# Patient Record
Sex: Male | Born: 1990 | Race: Black or African American | Hispanic: No | Marital: Single | Smoking: Never smoker
Health system: Southern US, Community
[De-identification: ages and names within clinical notes are randomized; demographics above are authoritative.]

## PROBLEM LIST (undated history)

## (undated) DIAGNOSIS — J309 Allergic rhinitis, unspecified: Secondary | ICD-10-CM

## (undated) DIAGNOSIS — K649 Unspecified hemorrhoids: Secondary | ICD-10-CM

## (undated) HISTORY — DX: Unspecified hemorrhoids: K64.9

## (undated) HISTORY — DX: Allergic rhinitis, unspecified: J30.9

## (undated) HISTORY — PX: HEMORRHOID BANDING: SHX5850

---
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
## 2011-10-22 ENCOUNTER — Encounter: Payer: Self-pay | Admitting: Internal Medicine

## 2011-10-22 DIAGNOSIS — Z0001 Encounter for general adult medical examination with abnormal findings: Secondary | ICD-10-CM | POA: Insufficient documentation

## 2011-10-22 DIAGNOSIS — Z0000 Encounter for general adult medical examination without abnormal findings: Secondary | ICD-10-CM | POA: Insufficient documentation

## 2011-10-28 ENCOUNTER — Ambulatory Visit (INDEPENDENT_AMBULATORY_CARE_PROVIDER_SITE_OTHER): Payer: BC Managed Care – PPO | Admitting: Internal Medicine

## 2011-10-28 ENCOUNTER — Encounter: Payer: Self-pay | Admitting: Internal Medicine

## 2011-10-28 ENCOUNTER — Other Ambulatory Visit (INDEPENDENT_AMBULATORY_CARE_PROVIDER_SITE_OTHER): Payer: BC Managed Care – PPO

## 2011-10-28 VITALS — BP 118/78 | HR 75 | Temp 97.9°F | Ht 69.0 in | Wt 163.0 lb

## 2011-10-28 DIAGNOSIS — M928 Other specified juvenile osteochondrosis: Secondary | ICD-10-CM

## 2011-10-28 DIAGNOSIS — Z0000 Encounter for general adult medical examination without abnormal findings: Secondary | ICD-10-CM

## 2011-10-28 DIAGNOSIS — M92529 Juvenile osteochondrosis of tibia tubercle, unspecified leg: Secondary | ICD-10-CM | POA: Insufficient documentation

## 2011-10-28 LAB — URINALYSIS, ROUTINE W REFLEX MICROSCOPIC
Bilirubin Urine: NEGATIVE
Hgb urine dipstick: NEGATIVE
Leukocytes, UA: NEGATIVE
Nitrite: NEGATIVE
Specific Gravity, Urine: >=1.03 (ref 1.000–1.030)
Urine Glucose: NEGATIVE
Urobilinogen, UA: 0.2 (ref 0.0–1.0)
pH: 6 (ref 5.0–8.0)

## 2011-10-28 LAB — LIPID PANEL
Cholesterol: 111 mg/dL (ref 0–200)
HDL: 45.3 mg/dL (ref >39.00)
LDL Cholesterol: 57 mg/dL (ref 0–99)
Total CHOL/HDL Ratio: 2
Triglycerides: 46 mg/dL (ref 0.0–149.0)
VLDL: 9.2 mg/dL (ref 0.0–40.0)

## 2011-10-28 LAB — HEPATIC FUNCTION PANEL
ALT: 18 U/L (ref 0–53)
AST: 22 U/L (ref 0–37)
Albumin: 4.3 g/dL (ref 3.5–5.2)
Alkaline Phosphatase: 54 U/L (ref 39–117)
Bilirubin, Direct: 0.1 mg/dL (ref 0.0–0.3)
Total Bilirubin: 0.7 mg/dL (ref 0.3–1.2)
Total Protein: 6.9 g/dL (ref 6.0–8.3)

## 2011-10-28 LAB — BASIC METABOLIC PANEL
BUN: 12 mg/dL (ref 6–23)
CO2: 30 mEq/L (ref 19–32)
Calcium: 9.4 mg/dL (ref 8.4–10.5)
Chloride: 102 mEq/L (ref 96–112)
Creatinine, Ser: 1.1 mg/dL (ref 0.4–1.5)
GFR: 110.29 mL/min (ref >60.00)
Glucose, Bld: 93 mg/dL (ref 70–99)
Potassium: 3.5 mEq/L (ref 3.5–5.1)
Sodium: 140 mEq/L (ref 135–145)

## 2011-10-28 LAB — CBC WITH DIFFERENTIAL/PLATELET
Basophils Absolute: 0 10*3/uL (ref 0.0–0.1)
Basophils Relative: 0.8 % (ref 0.0–3.0)
Eosinophils Absolute: 0.1 10*3/uL (ref 0.0–0.7)
Eosinophils Relative: 2.7 % (ref 0.0–5.0)
HCT: 43.1 % (ref 39.0–52.0)
Hemoglobin: 14.5 g/dL (ref 13.0–17.0)
Lymphocytes Relative: 36.1 % (ref 12.0–46.0)
Lymphs Abs: 1.5 10*3/uL (ref 0.7–4.0)
MCHC: 33.8 g/dL (ref 30.0–36.0)
MCV: 84 fl (ref 78.0–100.0)
Monocytes Absolute: 0.7 10*3/uL (ref 0.1–1.0)
Monocytes Relative: 16.5 % — ABNORMAL HIGH (ref 3.0–12.0)
Neutro Abs: 1.9 10*3/uL (ref 1.4–7.7)
Neutrophils Relative %: 43.9 % (ref 43.0–77.0)
Platelets: 208 10*3/uL (ref 150.0–400.0)
RBC: 5.13 Mil/uL (ref 4.22–5.81)
RDW: 13.5 % (ref 11.5–14.6)
WBC: 4.3 10*3/uL — ABNORMAL LOW (ref 4.5–10.5)

## 2011-10-28 LAB — TSH: TSH: 2.55 u[IU]/mL (ref 0.35–5.50)

## 2011-10-28 MED ORDER — NAPROXEN 500 MG PO TABS: 500.0000 mg | ORAL_TABLET | Freq: Two times a day (BID) | ORAL | Status: AC

## 2011-10-28 NOTE — Patient Instructions (Signed)
Take all new medications as prescribed Continue all other medications as before Please go to LAB in the Basement for the blood and/or urine tests to be done today Please call the phone number [phone] (the [employer] System) for results of testing in 2-3 days;  When calling, simply dial the number, and when prompted enter the MRN number above (the Medical Record Number) and the # key, then the message should start. Please return in 1 year for your yearly visit, or sooner if needed

## 2011-10-29 ENCOUNTER — Encounter: Payer: Self-pay | Admitting: Internal Medicine

## 2011-10-29 NOTE — Progress Notes (Signed)
  Subjective:    Patient ID: Xavier Cochran, male    DOB: [birth year], 20 y.o.   MRN: [id number]  HPI here with father as new pt - Here for wellness and f/u;  Overall doing ok;  Pt denies CP, worsening SOB, DOE, wheezing, orthopnea, PND, worsening LE edema, palpitations, dizziness or syncope.  Pt denies neurological change such as new Headache, facial or extremity weakness.  Pt denies polydipsia, polyuria, or low sugar symptoms. Pt states overall good compliance with treatment and medications, good tolerability, and trying to follow lower cholesterol diet.  Pt denies worsening depressive symptoms, suicidal ideation or panic. No fever, wt loss, night sweats, loss of appetite, or other constitutional symptoms.  Pt states good ability with ADL's, low fall risk, home safety reviewed and adequate, no significant changes in hearing or vision, and regularly with exercise, c/o discomfort to the patellar/tibial insertion site with mild swelling, plays basketball frequently.[name] History reviewed. No pertinent past medical history. History reviewed. No pertinent past surgical history.  reports that he has never smoked. He does not have any smokeless tobacco history on file. He reports that he does not drink alcohol or use illicit drugs. family history includes Arthritis in his other and Hypertension in his other. No Known Allergies No current outpatient prescriptions on file prior to visit.   Review of Systems Review of Systems  Constitutional: Negative for diaphoresis, activity change, appetite change and unexpected weight change.  HENT: Negative for hearing loss, ear pain, facial swelling, mouth sores and neck stiffness.   Eyes: Negative for pain, redness and visual disturbance.  Respiratory: Negative for shortness of breath and wheezing.   Cardiovascular: Negative for chest pain and palpitations.  Gastrointestinal: Negative for diarrhea, blood in stool, abdominal distention and rectal pain.  Genitourinary: Negative  for hematuria, flank pain and decreased urine volume.  Musculoskeletal: Negative for myalgias and joint swelling.  Skin: Negative for color change and wound.  Neurological: Negative for syncope and numbness.  Hematological: Negative for adenopathy.  Psychiatric/Behavioral: Negative for hallucinations, self-injury, decreased concentration and agitation.      Objective:   Physical Exam BP 118/78  Pulse 75  Temp(Src) 97.9 F (36.6 C) (Oral)  Ht 5\' 9"  (1.753 m)  Wt 163 lb (73.936 kg)  BMI 24.07 kg/m2  SpO2 98% Physical Exam  VS noted Constitutional: Pt is oriented to person, place, and time. Appears well-developed and well-nourished.  HENT:  Head: Normocephalic and atraumatic.  Right Ear: External ear normal.  Left Ear: External ear normal.  Nose: Nose normal.  Mouth/Throat: Oropharynx is clear and moist.  Eyes: Conjunctivae and EOM are normal. Pupils are equal, round, and reactive to light.  Neck: Normal range of motion. Neck supple. No JVD present. No tracheal deviation present.  Cardiovascular: Normal rate, regular rhythm, normal heart sounds and intact distal pulses.   Pulmonary/Chest: Effort normal and breath sounds normal.  Abdominal: Soft. Bowel sounds are normal. There is no tenderness.  Musculoskeletal: Normal range of motion. Exhibits no edema.  Lymphadenopathy:  Has no cervical adenopathy.  Neurological: Pt is alert and oriented to person, place, and time. Pt has normal reflexes. No cranial nerve deficit.  Skin: Skin is warm and dry. No rash noted.  Psychiatric:  Has  normal mood and affect. Behavior is normal.  Bilat patellar/tibial insertion sites mild tender/firm swelling    Assessment & Plan:

## 2011-10-29 NOTE — Assessment & Plan Note (Signed)

## 2011-10-29 NOTE — Assessment & Plan Note (Signed)
Mild to mod, for nsiad prn,  to f/u any worsening symptoms or concerns

## 2012-01-14 ENCOUNTER — Encounter: Payer: Self-pay | Admitting: Family Medicine

## 2012-01-14 ENCOUNTER — Ambulatory Visit (INDEPENDENT_AMBULATORY_CARE_PROVIDER_SITE_OTHER): Payer: BC Managed Care – PPO | Admitting: Family Medicine

## 2012-01-14 VITALS — BP 110/70 | Temp 98.7°F | Wt 160.0 lb

## 2012-01-14 DIAGNOSIS — R300 Dysuria: Secondary | ICD-10-CM | POA: Insufficient documentation

## 2012-01-14 DIAGNOSIS — R3915 Urgency of urination: Secondary | ICD-10-CM

## 2012-01-14 LAB — POCT URINALYSIS DIPSTICK
Bilirubin, UA: NEGATIVE
Blood, UA: NEGATIVE
Glucose, UA: NEGATIVE
Ketones, UA: NEGATIVE
Leukocytes, UA: NEGATIVE
Nitrite, UA: NEGATIVE
Protein, UA: NEGATIVE
Spec Grav, UA: >=1.03
Urobilinogen, UA: 0.2
pH, UA: 6

## 2012-01-14 NOTE — Patient Instructions (Signed)
Follow up with your regular doctor as needed, if you continue to have symptoms.  We'll contact you with your lab report.

## 2012-01-14 NOTE — Assessment & Plan Note (Signed)
Now with no sx, normal exam, and normal U/a.  Unable to check GC/Chlam due to recent urination.  Will check u micro and ucx, will have pt f/u with PMD if sx continue.  Per patient, was prev tested for STDs and was neg.

## 2012-01-14 NOTE — Progress Notes (Signed)
Sx started about 1 week ago.  Had a few sharp pains in R side of abd/back, then some urinary frequency.  Middle of the week had some lower anterior abd pain. "The tube that comes up from the right testicle is hurting, off and on" for last 3 days.  No burning with urination.  No FCNAVD.  No discharge.  No h/o STD.  No unprotected sex, only protected.    U/a was collected before we could consider collecting GC/chlam probe.     Meds, vitals, and allergies reviewed.   ROS: See HPI.  Otherwise, noncontributory.  nad ncat Mmm rrr ctab abd soft, not ttp, no rebound, no masses No cva pain Testes bilaterally descended without nodularity, tenderness or masses. No scrotal masses or lesions. No penis lesions or urethral discharge. No hernia

## 2012-01-14 NOTE — Progress Notes (Signed)
Addended by: Melchor Amour on: 01/14/2012 11:13 AM   Modules accepted: Orders

## 2012-10-24 ENCOUNTER — Encounter: Payer: Self-pay | Admitting: Internal Medicine

## 2012-10-24 ENCOUNTER — Ambulatory Visit (INDEPENDENT_AMBULATORY_CARE_PROVIDER_SITE_OTHER): Payer: BC Managed Care – PPO | Admitting: Internal Medicine

## 2012-10-24 VITALS — BP 102/78 | HR 66 | Temp 98.1°F | Ht 69.0 in | Wt 161.0 lb

## 2012-10-24 DIAGNOSIS — K645 Perianal venous thrombosis: Secondary | ICD-10-CM

## 2012-10-24 MED ORDER — HYDROCORTISONE 2.5 % EX CREA: TOPICAL_CREAM | Freq: Two times a day (BID) | CUTANEOUS | Status: DC

## 2012-10-24 NOTE — Patient Instructions (Addendum)
Take all new medications as prescribed Continue all other medications as before Please remember to sign up for My Chart at your earliest convenience, as this will be important to you in the future with finding out test results.  

## 2012-10-26 ENCOUNTER — Encounter: Payer: Self-pay | Admitting: Internal Medicine

## 2012-10-26 DIAGNOSIS — J309 Allergic rhinitis, unspecified: Secondary | ICD-10-CM | POA: Insufficient documentation

## 2012-10-26 NOTE — Assessment & Plan Note (Signed)
Mild to mod, for topical steroid cream prn,   to f/u any worsening symptoms or concerns 

## 2012-10-26 NOTE — Progress Notes (Signed)
  Subjective:    Patient ID: Xavier Geraghty, male    DOB: 10-Aug-1991, 21 y.o.   MRN: [id number]  HPI  Here to f/u, c/o new onset mild discomfort to anal area, wondering if it might be some kind of cyst, nothing makes better, worse to sit, onset x 1 wk, worse pain to start, now somewhat better, no bleeding or prior hx.   Past Medical History  Diagnosis Date  . Allergic rhinitis, cause unspecified    History reviewed. No pertinent past surgical history.  reports that he has never smoked. He does not have any smokeless tobacco history on file. He reports that he does not drink alcohol or use illicit drugs. family history includes Arthritis in his other; Cancer in his maternal grandfather; Diabetes in his maternal grandfather; Hypertension in his other; and Lupus in his mother. No Known Allergies Current Outpatient Prescriptions on File Prior to Visit  Medication Sig Dispense Refill  . naproxen (NAPROSYN) 500 MG tablet Take 1 tablet (500 mg total) by mouth 2 (two) times daily with a meal.  60 tablet  5   Review of Systems All otherwise neg per pt    Objective:   Physical Exam BP 102/78  Pulse 66  Temp 98.1 F (36.7 C) (Oral)  Ht 5\' 9"  (1.753 m)  Wt 161 lb (73.029 kg)  BMI 23.78 kg/m2  SpO2 98% Physical Exam  VS noted Constitutional: Pt appears well-developed and well-nourished.  HENT: Head: Normocephalic.  Right Ear: External ear normal.  Left Ear: External ear normal.  Eyes: Conjunctivae and EOM are normal. Neck: Normal range of motion. Neck supple.  Cardiovascular: Normal rate and regular rhythm.   Pulmonary/Chest: Effort normal and breath sounds normal.  Abd:  Soft, NT, +BS Rectal:  + mild tender 1/2 cm thrombosed ext hemorrhoid Neurological: Pt is alert. Not confused  Skin: Skin is warm. No erythema.     Assessment & Plan:

## 2012-10-29 ENCOUNTER — Ambulatory Visit: Payer: BC Managed Care – PPO | Admitting: Internal Medicine

## 2012-11-02 ENCOUNTER — Encounter: Payer: Self-pay | Admitting: Internal Medicine

## 2012-11-02 ENCOUNTER — Ambulatory Visit: Payer: BC Managed Care – PPO

## 2012-11-02 ENCOUNTER — Ambulatory Visit (INDEPENDENT_AMBULATORY_CARE_PROVIDER_SITE_OTHER): Payer: BC Managed Care – PPO | Admitting: Internal Medicine

## 2012-11-02 VITALS — BP 100/62 | HR 68 | Temp 98.0°F | Ht 69.0 in | Wt 160.5 lb

## 2012-11-02 DIAGNOSIS — Z0000 Encounter for general adult medical examination without abnormal findings: Secondary | ICD-10-CM

## 2012-11-02 DIAGNOSIS — A64 Unspecified sexually transmitted disease: Secondary | ICD-10-CM | POA: Insufficient documentation

## 2012-11-02 DIAGNOSIS — R300 Dysuria: Secondary | ICD-10-CM

## 2012-11-02 DIAGNOSIS — R3915 Urgency of urination: Secondary | ICD-10-CM

## 2012-11-02 LAB — CBC WITH DIFFERENTIAL/PLATELET
Basophils Relative: 1 % (ref 0.0–3.0)
Eosinophils Absolute: 0.1 10*3/uL (ref 0.0–0.7)
Eosinophils Relative: 2.5 % (ref 0.0–5.0)
Hemoglobin: 15.5 g/dL (ref 13.0–17.0)
Lymphocytes Relative: 42.1 % (ref 12.0–46.0)
MCHC: 33 g/dL (ref 30.0–36.0)
MCV: 83.5 fl (ref 78.0–100.0)
Neutro Abs: 1.6 10*3/uL (ref 1.4–7.7)
Neutrophils Relative %: 41.5 % — ABNORMAL LOW (ref 43.0–77.0)
RBC: 5.62 Mil/uL (ref 4.22–5.81)
WBC: 3.8 10*3/uL — ABNORMAL LOW (ref 4.5–10.5)

## 2012-11-02 LAB — LIPID PANEL
Cholesterol: 128 mg/dL (ref 0–200)
HDL: 47.1 mg/dL (ref >39.00)
LDL Cholesterol: 72 mg/dL (ref 0–99)
Total CHOL/HDL Ratio: 3
Triglycerides: 46 mg/dL (ref 0.0–149.0)
VLDL: 9.2 mg/dL (ref 0.0–40.0)

## 2012-11-02 LAB — BASIC METABOLIC PANEL
BUN: 16 mg/dL (ref 6–23)
Calcium: 9.6 mg/dL (ref 8.4–10.5)
Creatinine, Ser: 1.2 mg/dL (ref 0.4–1.5)

## 2012-11-02 LAB — URINALYSIS, ROUTINE W REFLEX MICROSCOPIC
Total Protein, Urine: NEGATIVE
Urine Glucose: NEGATIVE
pH: 6 (ref 5.0–8.0)

## 2012-11-02 LAB — HEPATIC FUNCTION PANEL
ALT: 19 U/L (ref 0–53)
Bilirubin, Direct: 0.2 mg/dL (ref 0.0–0.3)
Total Bilirubin: 0.7 mg/dL (ref 0.3–1.2)

## 2012-11-02 NOTE — Assessment & Plan Note (Signed)
For lab eval today, exam benign

## 2012-11-02 NOTE — Patient Instructions (Addendum)
Continue all other medications as before Please go to LAB in the Basement for the blood and/or urine tests to be done today You will be contacted by phone if any changes need to be made immediately.  Otherwise, you will receive a letter about your results with an explanation, but please check with MyChart first. Thank you for enrolling in MyChart. Please follow the instructions below to securely access your online medical record. MyChart allows you to send messages to your doctor, view your test results, renew your prescriptions, schedule appointments, and more. To Log into MyChart, please go to [email or web address], and your Username is:  [email or web address] You are otherwise up to date with prevention measures Please return in 1 year for your yearly visit, or sooner if needed, with Lab testing done 3-5 days before

## 2012-11-02 NOTE — Assessment & Plan Note (Signed)

## 2012-11-02 NOTE — Progress Notes (Signed)
Subjective:    Patient ID: Xavier Cochran, male    DOB: 1991/03/13, 21 y.o.   MRN: [id number]  HPI  Here for wellness and f/u;  Overall doing ok;  Pt denies CP, worsening SOB, DOE, wheezing, orthopnea, PND, worsening LE edema, palpitations, dizziness or syncope.  Pt denies neurological change such as new Headache, facial or extremity weakness.  Pt denies polydipsia, polyuria, or low sugar symptoms. Pt states overall good compliance with treatment and medications, good tolerability, and trying to follow lower cholesterol diet.  Pt denies worsening depressive symptoms, suicidal ideation or panic. No fever, wt loss, night sweats, loss of appetite, or other constitutional symptoms.  Pt states good ability with ADL's, low fall risk, home safety reviewed and adequate, no significant changes in hearing or vision, and occasionally active with exercise.   Denies urinary symptoms such as dysuria, frequency, urgency,or hematuria, or penile d/c, but also asks for STD testing today Past Medical History  Diagnosis Date  . Allergic rhinitis, cause unspecified    No past surgical history on file.  reports that he has never smoked. He does not have any smokeless tobacco history on file. He reports that he does not drink alcohol or use illicit drugs. family history includes Arthritis in his other; Cancer in his maternal grandfather; Diabetes in his maternal grandfather; Hypertension in his other; and Lupus in his mother. No Known Allergies Current Outpatient Prescriptions on File Prior to Visit  Medication Sig Dispense Refill  . hydrocortisone 2.5 % cream Apply topically 2 (two) times daily.  30 g  0   Review of Systems Review of Systems  Constitutional: Negative for diaphoresis, activity change, appetite change and unexpected weight change.  HENT: Negative for hearing loss, ear pain, facial swelling, mouth sores and neck stiffness.   Eyes: Negative for pain, redness and visual disturbance.  Respiratory: Negative  for shortness of breath and wheezing.   Cardiovascular: Negative for chest pain and palpitations.  Gastrointestinal: Negative for diarrhea, blood in stool, abdominal distention and rectal pain.  Genitourinary: Negative for hematuria, flank pain and decreased urine volume.  Musculoskeletal: Negative for myalgias and joint swelling.  Skin: Negative for color change and wound.  Neurological: Negative for syncope and numbness.  Hematological: Negative for adenopathy.  Psychiatric/Behavioral: Negative for hallucinations, self-injury, decreased concentration and agitation.      Objective:   Physical Exam BP 100/62  Pulse 68  Temp 98 F (36.7 C) (Oral)  Ht 5\' 9"  (1.753 m)  Wt 160 lb 8 oz (72.802 kg)  BMI 23.70 kg/m2  SpO2 96% Physical Exam  VS noted Constitutional: Pt is oriented to person, place, and time. Appears well-developed and well-nourished.  HENT:  Head: Normocephalic and atraumatic.  Right Ear: External ear normal.  Left Ear: External ear normal.  Nose: Nose normal.  Mouth/Throat: Oropharynx is clear and moist.  Eyes: Conjunctivae and EOM are normal. Pupils are equal, round, and reactive to light.  Neck: Normal range of motion. Neck supple. No JVD present. No tracheal deviation present.  Cardiovascular: Normal rate, regular rhythm, normal heart sounds and intact distal pulses.   Pulmonary/Chest: Effort normal and breath sounds normal.  Abdominal: Soft. Bowel sounds are normal. There is no tenderness.  Musculoskeletal: Normal range of motion. Exhibits no edema.  Lymphadenopathy:  Has no cervical adenopathy.  Neurological: Pt is alert and oriented to person, place, and time. Pt has normal reflexes. No cranial nerve deficit.  Skin: Skin is warm and dry. No rash noted.  Psychiatric:  Has  normal mood and affect. Behavior is normal.     Assessment & Plan:

## 2013-01-12 ENCOUNTER — Other Ambulatory Visit: Payer: Self-pay

## 2013-06-10 ENCOUNTER — Ambulatory Visit (INDEPENDENT_AMBULATORY_CARE_PROVIDER_SITE_OTHER): Payer: BC Managed Care – PPO | Admitting: Internal Medicine

## 2013-06-10 ENCOUNTER — Ambulatory Visit: Payer: BC Managed Care – PPO | Admitting: Internal Medicine

## 2013-06-10 ENCOUNTER — Encounter: Payer: Self-pay | Admitting: Internal Medicine

## 2013-06-10 VITALS — BP 100/62 | HR 62 | Temp 97.3°F | Ht 69.0 in | Wt 161.2 lb

## 2013-06-10 DIAGNOSIS — R0609 Other forms of dyspnea: Secondary | ICD-10-CM | POA: Insufficient documentation

## 2013-06-10 DIAGNOSIS — J309 Allergic rhinitis, unspecified: Secondary | ICD-10-CM

## 2013-06-10 DIAGNOSIS — R0989 Other specified symptoms and signs involving the circulatory and respiratory systems: Secondary | ICD-10-CM

## 2013-06-10 MED ORDER — FLUTICASONE PROPIONATE 50 MCG/ACT NA SUSP: 2.0000 | Freq: Every day | NASAL | Status: DC

## 2013-06-10 MED ORDER — ALBUTEROL SULFATE HFA 108 (90 BASE) MCG/ACT IN AERS: 2.0000 | INHALATION_SPRAY | Freq: Four times a day (QID) | RESPIRATORY_TRACT | Status: DC | PRN

## 2013-06-10 MED ORDER — METHYLPREDNISOLONE ACETATE 80 MG/ML IJ SUSP
80.0000 mg | Freq: Once | INTRAMUSCULAR | Status: AC
  Administered 2013-06-10: 80 mg via INTRAMUSCULAR

## 2013-06-10 MED ORDER — CETIRIZINE HCL 10 MG PO TABS: 10.0000 mg | ORAL_TABLET | Freq: Every day | ORAL | Status: DC

## 2013-06-10 NOTE — Assessment & Plan Note (Signed)
Mild to mod, for depo 80 mg, then zyrtec/flonase, consider allergy referral  to f/u any worsening symptoms or concerns

## 2013-06-10 NOTE — Assessment & Plan Note (Signed)
Exam benign and symptoms c/w mild intermittent asthma, for trial alb MDI, hold on Cxr for now, to f/u any worsening symptoms or concerns, consider inhaled steroid/cxr if not improved

## 2013-06-10 NOTE — Addendum Note (Signed)
Addended by: Scharlene Gloss B on: 06/10/2013 10:16 AM   Modules accepted: Orders

## 2013-06-10 NOTE — Progress Notes (Signed)
  Subjective:    Patient ID: Xavier Cochran, male    DOB: 16-Oct-1991, 22 y.o.   MRN: [id number]  HPI  Here to f/u -Does have several wks ongoing nasal allergy symptoms with clearish congestion, itch and sneezing, without fever, pain, ST, cough, swelling or wheezing, with nasal congestion to the point he had to use a qtip "to keep the one side open."  Also with a sense of intermittent chest tightness, doe, mild a few times per wk, denies frank wheezing or nighttime awakening   Pt denies fever, wt loss, night sweats, loss of appetite, or other constitutional symptoms Past Medical History  Diagnosis Date  . Allergic rhinitis, cause unspecified    No past surgical history on file.  reports that he has never smoked. He does not have any smokeless tobacco history on file. He reports that he does not drink alcohol or use illicit drugs. family history includes Arthritis in his other; Cancer in his maternal grandfather; Diabetes in his maternal grandfather; Hypertension in his other; and Lupus in his mother. No Known Allergies Current Outpatient Prescriptions on File Prior to Visit  Medication Sig Dispense Refill  . hydrocortisone 2.5 % cream Apply topically 2 (two) times daily.  30 g  0   No current facility-administered medications on file prior to visit.   Review of Systems All otherwise neg per pt    Objective:   Physical Exam BP 100/62  Pulse 62  Temp(Src) 97.3 F (36.3 C) (Oral)  Ht 5\' 9"  (1.753 m)  Wt 161 lb 4 oz (73.143 kg)  BMI 23.8 kg/m2  SpO2 96% VS noted,  Constitutional: Pt appears well-developed and well-nourished.  HENT: Head: NCAT.  Right Ear: External ear normal.  Left Ear: External ear normal.  Eyes: Conjunctivae and EOM are normal. Pupils are equal, round, and reactive to light.  Neck: Normal range of motion. Neck supple.  Bilat tm's with mild erythema.  Max sinus areas mild tender.  Pharynx with mild erythema, no exudate Cardiovascular: Normal rate and regular rhythm.    Pulmonary/Chest: Effort normal and breath sounds normal.  - no rales or wheezing Abd:  Soft, NT, non-distended, + BS Neurological: Pt is alert. Not confused  Skin: Skin is warm. No erythema.  Psychiatric: Pt behavior is normal. Thought content normal.     Assessment & Plan:

## 2013-06-10 NOTE — Patient Instructions (Signed)
You had the steroid shot today Please take all new medication as prescribed - the zyrtec, flonase, and albuterol Inhaler Please continue all other medications as before, and refills have been done if requested.  Please remember to sign up for My Chart if you have not done so, as this will be important to you in the future with finding out test results, communicating by private email, and scheduling acute appointments online when needed.  I think we can hold off on further blood work or CXR today, but please return if not improved in the next 3-5 days

## 2013-09-27 DIAGNOSIS — K4090 Unilateral inguinal hernia, without obstruction or gangrene, not specified as recurrent: Secondary | ICD-10-CM | POA: Insufficient documentation

## 2013-10-03 ENCOUNTER — Other Ambulatory Visit: Payer: Self-pay

## 2013-11-15 DIAGNOSIS — R809 Proteinuria, unspecified: Secondary | ICD-10-CM | POA: Insufficient documentation

## 2014-02-03 DIAGNOSIS — M25511 Pain in right shoulder: Secondary | ICD-10-CM | POA: Insufficient documentation

## 2015-01-17 DIAGNOSIS — M25311 Other instability, right shoulder: Secondary | ICD-10-CM | POA: Insufficient documentation

## 2015-05-29 ENCOUNTER — Ambulatory Visit (INDEPENDENT_AMBULATORY_CARE_PROVIDER_SITE_OTHER): Payer: BLUE CROSS/BLUE SHIELD | Admitting: Internal Medicine

## 2015-05-29 ENCOUNTER — Encounter: Payer: Self-pay | Admitting: Internal Medicine

## 2015-05-29 VITALS — BP 112/76 | HR 68 | Temp 97.9°F | Ht 69.0 in | Wt 169.0 lb

## 2015-05-29 DIAGNOSIS — M25561 Pain in right knee: Secondary | ICD-10-CM | POA: Diagnosis not present

## 2015-05-29 MED ORDER — NAPROXEN 500 MG PO TABS: 500.0000 mg | ORAL_TABLET | Freq: Two times a day (BID) | ORAL | Status: DC

## 2015-05-29 NOTE — Progress Notes (Signed)
Pre visit review using our clinic review tool, if applicable. No additional management support is needed unless otherwise documented below in the visit note. 

## 2015-05-29 NOTE — Patient Instructions (Signed)
Please take all new medication as prescribed - the anti-inflammatory  Please also use ice three times daily for 15 minutes  OK for off work today, as well as the time off you have taken for next week to next Thursday to return to work  Please consider seeing Dr Smith/sports medicine in this office if not improved in 3-5 days  Please continue all other medications as before, and refills have been done if requested.  Please have the pharmacy call with any other refills you may need.  Please keep your appointments with your specialists as you may have planned

## 2015-05-29 NOTE — Assessment & Plan Note (Addendum)
C/w patellar tendonitis but cant r/o other given some lateral joint line tender as well  - for nsaid prn, ice tid, knee brace as he does, off work today, to f/u dr smith/sport med if not improved 3-5 days

## 2015-05-29 NOTE — Progress Notes (Signed)
   Subjective:    Patient ID: Xavier SimmerIvan Jacot Jr., male    DOB: 01/02/1991, 24 y.o.   MRN: [id number]  HPI  Here with 10 day onset sharp right knee pain, today actually not that bad, but was 9/10 last night, not sure if twisted, no falls or trauma, better with polystyrene knee brace, located mostly just below the patella, but also somewhtat to the right lateral and prox patellar area, seems swollen last few days as well, voerall pain seems to be getting worse, not better with elevation.  Last MRI approx 2 yrs ago  - not sure, but thinks results were normal.  Wants to get weak and limps, but not really giveaways or trying to fall.  No fever.  No GI upset, rash, other joint pains.  No STD symptoms.  Works Education officer, [employer]ing [employer] Past Medical History  Diagnosis Date  . Allergic rhinitis, cause unspecified    No past surgical history on file.  reports that he has never smoked. He does not have any smokeless tobacco history on file. He reports that he does not drink alcohol or use illicit drugs. family history includes Arthritis in his other; Cancer in his maternal grandfather; Diabetes in his maternal grandfather; Hypertension in his other; Lupus in his mother. No Known Allergies Current Outpatient Prescriptions on File Prior to Visit  Medication Sig Dispense Refill  . albuterol (PROVENTIL HFA;VENTOLIN HFA) 108 (90 BASE) MCG/ACT inhaler Inhale 2 puffs into the lungs every 6 (six) hours as needed for shortness of breath. (Patient not taking: Reported on 05/29/2015) 1 Inhaler 11  . cetirizine (ZYRTEC) 10 MG tablet Take 1 tablet (10 mg total) by mouth daily. (Patient not taking: Reported on 05/29/2015) 90 tablet 3  . fluticasone (FLONASE) 50 MCG/ACT nasal spray Place 2 sprays into the nose daily. (Patient not taking: Reported on 05/29/2015) 16 g 2  . hydrocortisone 2.5 % cream Apply topically 2 (two) times daily. (Patient not taking: Reported on 05/29/2015) 30 g 0   No current facility-administered medications on file prior to  visit.   Review of Systems All otherwise neg per pt     Objective:   Physical Exam BP 112/76 mmHg  Pulse 68  Temp(Src) 97.9 F (36.6 C) (Oral)  Ht 5\' 9"  (1.753 m)  Wt 169 lb (76.658 kg)  BMI 24.95 kg/m2  SpO2 97% VS noted,  Constitutional: Pt appears in no significant distress HENT: Head: NCAT.  Right Ear: External ear normal.  Left Ear: External ear normal.  Eyes: . Pupils are equal, round, and reactive to light. Conjunctivae and EOM are normal Neck: Normal range of motion. Neck supple.  Cardiovascular: Normal rate and regular rhythm.   Pulmonary/Chest: Effort normal and breath sounds without rales or wheezing.  Neurological: Pt is alert. Not confused , motor grossly intact, gait somewhat antalgic Skin: Skin is warm. No rash, no LE edema Psychiatric: Pt behavior is normal. No agitation.  Right knee with patellar tendon tednerness, slight over the lateral joint line as well with mild swelling primarily anterior knee, extnesion limited by 10 degreees estimated but full flexion, LE o/w neurovasc intact, neg lockmans    Assessment & Plan:

## 2015-06-16 ENCOUNTER — Ambulatory Visit: Payer: BLUE CROSS/BLUE SHIELD | Admitting: Family Medicine

## 2015-08-10 ENCOUNTER — Other Ambulatory Visit (INDEPENDENT_AMBULATORY_CARE_PROVIDER_SITE_OTHER): Payer: BLUE CROSS/BLUE SHIELD

## 2015-08-10 ENCOUNTER — Ambulatory Visit (INDEPENDENT_AMBULATORY_CARE_PROVIDER_SITE_OTHER): Payer: BLUE CROSS/BLUE SHIELD | Admitting: Family Medicine

## 2015-08-10 ENCOUNTER — Encounter: Payer: Self-pay | Admitting: Family Medicine

## 2015-08-10 VITALS — BP 100/68 | HR 53 | Ht 70.0 in | Wt 168.0 lb

## 2015-08-10 DIAGNOSIS — M25561 Pain in right knee: Secondary | ICD-10-CM

## 2015-08-10 MED ORDER — DICLOFENAC SODIUM 2 % TD SOLN: TRANSDERMAL | Status: DC

## 2015-08-10 NOTE — Assessment & Plan Note (Signed)
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 When patient does put a lot of pressure on his knee repetitively at think that this is contribute in. We discussed a patellar strap, home exercises and patient work with Event organiser. We discussed icing regimen and patient given an topical anti-inflammatory. Patient will try to make these changes and come back and see me again in 4 weeks. Continuing to have trouble we be need to consider further imaging.

## 2015-08-10 NOTE — Patient Instructions (Signed)
Nice to meet you Xavier Cochran Jumpers knee strap - look online to see where you can get it for cheapest  Ice 20 minutes 2 times daily. Usually after activity and before bed. Exercises 3 times a week.  pennsaid pinkie amount topically 2 times daily as needed.  Avoid direct impact on the knee See me again in 4 weeks to make sure it resolves.

## 2015-08-10 NOTE — Progress Notes (Signed)
Pre visit review using our clinic review tool, if applicable. No additional management support is needed unless otherwise documented below in the visit note. 

## 2015-08-10 NOTE — Progress Notes (Signed)
[name] [address] [place], [place] [address] Phone: [phone] Subjective:    I'm seeing this patient by the request  of:  [name], MD   CC: Right knee pain  [id number] Xavier Cochran. is a 24 y.o. male coming in with complaint of right knee pain. Patient has this pain intimately for quite some time but seems to be worse over the course last 2 months. Patient is notices when he puts direct contact in the knee and seems to hurt just inferior to his patella. Patient denies any radiation. An states that it is more of a soreness. Going up or down stairs can be a little bit uncomfortable. Patient denies any giving out on him, any weakness, patient rates the severity of pain a 5 out of 10. Describes it as a dull ache with no sharp pain. Denies any worsening with twisting motions. Patient has seen another provider for this previously about 2 weeks ago and was given an anti-inflammatory. Patient states that was helpful but never completely resolved.  Past Medical History  Diagnosis Date  . Allergic rhinitis, cause unspecified    No past surgical history on file. Social History  Substance Use Topics  . Smoking status: Never Smoker   . Smokeless tobacco: None  . Alcohol Use: No   ]No Known Allergies Family History  Problem Relation Age of Onset  . Arthritis Other   . Hypertension Other   . Lupus Mother   . Cancer Maternal Grandfather   . Diabetes Maternal Grandfather         Past medical history, social, surgical and family history all reviewed in electronic medical record.   Review of Systems: No headache, visual changes, nausea, vomiting, diarrhea, constipation, dizziness, abdominal pain, skin rash, fevers, chills, night sweats, weight loss, swollen lymph nodes, body aches, joint swelling, muscle aches, chest pain, shortness of breath, mood changes.   Objective Blood pressure 100/68, pulse 53, height  (1.778 m), weight 168 lb (76.204  kg), SpO2 97 %.  General: No apparent distress alert and oriented x3 mood and affect normal, dressed appropriately.  HEENT: Pupils equal, extraocular movements intact  Respiratory: Patient's speak in full sentences and does not appear short of breath  Cardiovascular: No lower extremity edema, non tender, no erythema  Skin: Warm dry intact with no signs of infection or rash on extremities or on axial skeleton.  Abdomen: Soft nontender  Neuro: Cranial nerves II through XII are intact, neurovascularly intact in all extremities with 2+ DTRs and 2+ pulses.  Lymph: No lymphadenopathy of posterior or anterior cervical chain or axillae bilaterally.  Gait normal with good balance and coordination.  MSK:  Non tender with full range of motion and good stability and symmetric strength and tone of shoulders, elbows, wrist, hip, and ankles bilaterally.  Knee: Right Normal to inspection with no erythema or effusion or obvious bony abnormalities. Palpation shows minimal tenderness over the patellar tendon ROM full in flexion and extension and lower leg rotation. Ligaments with solid consistent endpoints including ACL, PCL, LCL, MCL. Negative Mcmurray's, Apley's, and Thessalonian tests. Non painful patellar compression. Patellar glide without crepitus. Patellar and quadriceps tendons unremarkable. Hamstring and quadriceps strength is normal.    MSK US performed of: right knee.  This study was ordered, performed, and interpreted by Terrilee Files D.O.  Knee: All structures visualized. Anteromedial, anterolateral, posteromedial, and posterolateral menisci unremarkable without tearing, fraying, effusion, or displacement. Patellar Tendon unremarkable on long and transverse  views without effusion. Looking at the tendon itself inferior to this area seems to have a ganglion cyst No abnormality of prepatellar bursa. LCL and MCL unremarkable on long and transverse views. No abnormality of origin of medial or  lateral head of the gastrocnemius.  IMPRESSION:  Mild patellar tendinitis with questionable underlying ganglion cyst   Procedure note 97110; 15 minutes spent for Therapeutic exercises as stated in above notes.  This included exercises focusing on stretching, strengthening, with significant focus on eccentric aspects.  Vastus medialis obliques strengthening as well as eccentric strengthening with the quadriceps and hamstrings Proper technique shown and discussed handout in great detail with ATC.  All questions were discussed and answered.      Impression and Recommendations:     This case required medical decision making of moderate complexity.

## 2015-09-07 ENCOUNTER — Ambulatory Visit (INDEPENDENT_AMBULATORY_CARE_PROVIDER_SITE_OTHER): Payer: 59 | Admitting: Family Medicine

## 2015-09-07 ENCOUNTER — Encounter: Payer: Self-pay | Admitting: Family Medicine

## 2015-09-07 VITALS — BP 122/82 | HR 65 | Wt 170.0 lb

## 2015-09-07 DIAGNOSIS — M25561 Pain in right knee: Secondary | ICD-10-CM | POA: Diagnosis not present

## 2015-09-07 MED ORDER — NITROGLYCERIN 0.2 MG/HR TD PT24: MEDICATED_PATCH | TRANSDERMAL | Status: DC

## 2015-09-07 NOTE — Assessment & Plan Note (Signed)
Patient overall seems to be doing relatively well. He is not healed completely and would like to be so. We discussed many different treatment options and patient has elected try the nitroglycerin patch. Do a quarter patch daily. We discussed the possible side effects and when to seek medical attention. We discussed icing regimen. Patient will continue with topical anti-inflammatory's as well. Continuing home exercises. See me again in 4-6 weeks for further evaluation knowing that likely he'll need to patches for total of 12 weeks at least.

## 2015-09-07 NOTE — Patient Instructions (Signed)
Good to see you Keep the exercises going Wear brace at least with work Ice at the end of the day +/- cream Nitroglycerin Protocol   Apply 1/4 nitroglycerin patch to affected area daily.  Change position of patch within the affected area every 24 hours.  You may experience a headache during the first 1-2 weeks of using the patch, these should subside.  If you experience headaches after beginning nitroglycerin patch treatment, you may take your preferred over the counter pain reliever.  Another side effect of the nitroglycerin patch is skin irritation or rash related to patch adhesive.  Please notify our office if you develop more severe headaches or rash, and stop the patch.  Tendon healing with nitroglycerin patch may require 12 to 24 weeks depending on the extent of injury.  Men should not use if taking Viagra, Cialis, or Levitra.   Do not use if you have migraines or rosacea.   see me again in 4-6 weeks.

## 2015-09-07 NOTE — Progress Notes (Signed)
  [name] [address] [place], [place] [address] Phone: [phone] Subjective:     CC: Right knee pain follow-up  [id number] Xavier Cochran. is a 24 y.o. male coming in with complaint of right knee pain. Patient was found to have more of a patellar tendinitis. Patient was to wear a patellar strap, do home exercises, icing and topical anti-inflammatory's. Patient states that he is approximately 80% better. Not doing the exercises regularly. Patient states that was helpful but never completely resolved. Still mild discomfort from time to time but not stopping him from his many activities as he was doing previously.  Past Medical History  Diagnosis Date  . Allergic rhinitis, cause unspecified    No past surgical history on file. Social History  Substance Use Topics  . Smoking status: Never Smoker   . Smokeless tobacco: None  . Alcohol Use: No   ]No Known Allergies Family History  Problem Relation Age of Onset  . Arthritis Other   . Hypertension Other   . Lupus Mother   . Cancer Maternal Grandfather   . Diabetes Maternal Grandfather         Past medical history, social, surgical and family history all reviewed in electronic medical record.   Review of Systems: No headache, visual changes, nausea, vomiting, diarrhea, constipation, dizziness, abdominal pain, skin rash, fevers, chills, night sweats, weight loss, swollen lymph nodes, body aches, joint swelling, muscle aches, chest pain, shortness of breath, mood changes.   Objective Blood pressure 122/82, pulse 65, weight 170 lb (77.111 kg), SpO2 97 %.  General: No apparent distress alert and oriented x3 mood and affect normal, dressed appropriately.  HEENT: Pupils equal, extraocular movements intact  Respiratory: Patient's speak in full sentences and does not appear short of breath  Cardiovascular: No lower extremity edema, non tender, no erythema  Skin: Warm dry intact with no signs of  infection or rash on extremities or on axial skeleton.  Abdomen: Soft nontender  Neuro: Cranial nerves II through XII are intact, neurovascularly intact in all extremities with 2+ DTRs and 2+ pulses.  Lymph: No lymphadenopathy of posterior or anterior cervical chain or axillae bilaterally.  Gait normal with good balance and coordination.  MSK:  Non tender with full range of motion and good stability and symmetric strength and tone of shoulders, elbows, wrist, hip, and ankles bilaterally.  Knee: Right Normal to inspection with no erythema or effusion or obvious bony abnormalities. Minimal tenderness over the patellar tendon still noted ROM full in flexion and extension and lower leg rotation. Ligaments with solid consistent endpoints including ACL, PCL, LCL, MCL. Negative Mcmurray's, Apley's, and Thessalonian tests. Non painful patellar compression. Patellar glide without crepitus. Patellar and quadriceps tendons unremarkable. Hamstring and quadriceps strength is normal.    MSK US performed of: right knee.  This study was ordered, performed, and interpreted by Terrilee Files D.O.  Knee: All structures visualized. Anteromedial, anterolateral, posteromedial, and posterolateral menisci unremarkable without tearing, fraying, effusion, or displacement. Patellar Tendon very minimal hypoechoic changes noted but no true tearing appreciated. Continued proximal cyst underlying non-tendon. No abnormality of prepatellar bursa. LCL and MCL unremarkable on long and transverse views. No abnormality of origin of medial or lateral head of the gastrocnemius.  IMPRESSION:  Mild patellar tendinitis with improvement    Impression and Recommendations:     This case required medical decision making of moderate complexity.

## 2015-09-07 NOTE — Progress Notes (Signed)
Pre visit review using our clinic review tool, if applicable. No additional management support is needed unless otherwise documented below in the visit note. 

## 2015-10-09 ENCOUNTER — Ambulatory Visit: Payer: 59 | Admitting: Family Medicine

## 2015-10-16 ENCOUNTER — Ambulatory Visit: Payer: 59 | Admitting: Family Medicine

## 2016-07-08 ENCOUNTER — Ambulatory Visit (INDEPENDENT_AMBULATORY_CARE_PROVIDER_SITE_OTHER): Payer: Managed Care, Other (non HMO) | Admitting: Internal Medicine

## 2016-07-08 ENCOUNTER — Encounter: Payer: Self-pay | Admitting: Internal Medicine

## 2016-07-08 VITALS — BP 126/72 | HR 68 | Temp 98.2°F | Resp 20 | Wt 178.0 lb

## 2016-07-08 DIAGNOSIS — K645 Perianal venous thrombosis: Secondary | ICD-10-CM

## 2016-07-08 DIAGNOSIS — K5900 Constipation, unspecified: Secondary | ICD-10-CM | POA: Diagnosis not present

## 2016-07-08 DIAGNOSIS — J309 Allergic rhinitis, unspecified: Secondary | ICD-10-CM | POA: Diagnosis not present

## 2016-07-08 MED ORDER — HYDROCORTISONE 2.5 % RE CREA: 1.0000 "application " | TOPICAL_CREAM | Freq: Two times a day (BID) | RECTAL | 1 refills | Status: DC | PRN

## 2016-07-08 NOTE — Patient Instructions (Signed)
Please take all new medication as prescribed - the cream  You can also use colace 100 mg twice per day (stool softner) as needed   You can also take miralax OTC 17 gm by mouth every day as needed for constipation as well (this is a laxative)  Please continue all other medications as before, and refills have been done if requested.  Please have the pharmacy call with any other refills you may need.  Please keep your appointments with your specialists as you may have planned

## 2016-07-08 NOTE — Progress Notes (Signed)
Pre visit review using our clinic review tool, if applicable. No additional management support is needed unless otherwise documented below in the visit note. 

## 2016-07-08 NOTE — Progress Notes (Signed)
   Subjective:    Patient ID: Xavier SimmerIvan Labrie Jr., male    DOB: 11/11/1991, 25 y.o.   MRN: [id number]  HPI   Here to f/u with 4 wks recurring constipation and intermittent strain at stool, now assoc with painful bump at the anus for 5 days with mod to severe pain to start, some improved last 2 days, ? otc cream might have helped a little bit. Denies worsening reflux, abd pain, dysphagia, n/v, diarrhea or blood. Has new position/job and moved recently. Mores stress recent.  No other abd pain, wt loss, blood,  fever Wt Readings from Last 3 Encounters:  07/08/16 178 lb (80.7 kg)  09/07/15 170 lb (77.1 kg)  08/10/15 168 lb (76.2 kg)  Pt denies chest pain, increased sob or doe, wheezing, orthopnea, PND, increased LE swelling, palpitations, dizziness or syncope.  Pt denies new neurological symptoms such as new headache, or facial or extremity weakness or numbness   Pt denies polydipsia, polyuria  Does have several wks ongoing nasal allergy symptoms with clearish congestion, itch and sneezing, without fever, pain, ST, cough, swelling or wheezing. Past Medical History:  Diagnosis Date  . Allergic rhinitis, cause unspecified    No past surgical history on file.  reports that he has never smoked. He does not have any smokeless tobacco history on file. He reports that he does not drink alcohol or use drugs. family history includes Arthritis in his other; Cancer in his maternal grandfather; Diabetes in his maternal grandfather; Hypertension in his other; Lupus in his mother. No Known Allergies No current outpatient prescriptions on file prior to visit.   No current facility-administered medications on file prior to visit.     Review of Systems  Constitutional: Negative for unusual diaphoresis or night sweats HENT: Negative for ear swelling or discharge Eyes: Negative for worsening visual haziness  Respiratory: Negative for choking and stridor.   Gastrointestinal: Negative for distension or worsening  eructation Genitourinary: Negative for retention or change in urine volume.  Musculoskeletal: Negative for other MSK pain or swelling Skin: Negative for color change and worsening wound Neurological: Negative for tremors and numbness other than noted  Psychiatric/Behavioral: Negative for decreased concentration or agitation other than above       Objective:   Physical Exam BP 126/72   Pulse 68   Temp 98.2 F (36.8 C) (Oral)   Resp 20   Wt 178 lb (80.7 kg)   SpO2 97%   BMI 25.54 kg/m  VS noted,  Constitutional: Pt appears in no apparent distress HENT: Head: NCAT.  Right Ear: External ear normal.  Left Ear: External ear normal.  Bilat tm's with mild erythema.  Max sinus areas non tender.  Pharynx with mild erythema, no exudate Eyes: . Pupils are equal, round, and reactive to light. Conjunctivae and EOM are normal Neck: Normal range of motion. Neck supple.  Cardiovascular: Normal rate and regular rhythm.   Pulmonary/Chest: Effort normal and breath sounds without rales or wheezing.  Abd:  Soft, NT, ND, + BS DRE: normal tone, no rectal mass, heme neg, prostate normal size/NT with + small to mod sized 7 oclock anal thrombosed hemorrhoid, no bleeding Neurological: Pt is alert. Not confused , motor grossly intact Skin: Skin is warm. No rash, no LE edema Psychiatric: Pt behavior is normal. No agitation.     Assessment & Plan:

## 2016-07-10 NOTE — Assessment & Plan Note (Signed)
Mild to mod, already improved symtpoms, no bleeding, for anusol HC prn,  to f/u any worsening symptoms or concerns

## 2016-07-10 NOTE — Assessment & Plan Note (Signed)
Ok for otc zyrtec prn,  to f/u any worsening symptoms or concerns  

## 2016-07-10 NOTE — Assessment & Plan Note (Addendum)
Etiology unclear, may be stress related Lab Results  Component Value Date   TSH 2.92 11/02/2012  consider f/u TSH but declines for now, for cont'd efforts at staying active, and diet

## 2018-04-26 ENCOUNTER — Ambulatory Visit: Payer: Managed Care, Other (non HMO) | Admitting: Internal Medicine

## 2018-04-26 ENCOUNTER — Encounter: Payer: Self-pay | Admitting: Internal Medicine

## 2018-04-26 VITALS — BP 118/76 | HR 76 | Temp 98.0°F | Ht 70.0 in | Wt 183.0 lb

## 2018-04-26 DIAGNOSIS — J4531 Mild persistent asthma with (acute) exacerbation: Secondary | ICD-10-CM

## 2018-04-26 DIAGNOSIS — J309 Allergic rhinitis, unspecified: Secondary | ICD-10-CM

## 2018-04-26 MED ORDER — METHYLPREDNISOLONE ACETATE 80 MG/ML IJ SUSP
80.0000 mg | Freq: Once | INTRAMUSCULAR | Status: AC
  Administered 2018-04-26: 80 mg via INTRAMUSCULAR

## 2018-04-26 MED ORDER — PREDNISONE 10 MG PO TABS: ORAL_TABLET | ORAL | 0 refills | Status: DC

## 2018-04-26 MED ORDER — BUDESONIDE-FORMOTEROL FUMARATE 160-4.5 MCG/ACT IN AERO: 2.0000 | INHALATION_SPRAY | Freq: Two times a day (BID) | RESPIRATORY_TRACT | 12 refills | Status: DC

## 2018-04-26 MED ORDER — CETIRIZINE HCL 10 MG PO TABS: 10.0000 mg | ORAL_TABLET | Freq: Every day | ORAL | 11 refills | Status: DC

## 2018-04-26 MED ORDER — TRIAMCINOLONE ACETONIDE 55 MCG/ACT NA AERO: 2.0000 | INHALATION_SPRAY | Freq: Every day | NASAL | 12 refills | Status: DC

## 2018-04-26 MED ORDER — ALBUTEROL SULFATE HFA 108 (90 BASE) MCG/ACT IN AERS: 2.0000 | INHALATION_SPRAY | Freq: Four times a day (QID) | RESPIRATORY_TRACT | 5 refills | Status: DC | PRN

## 2018-04-26 NOTE — Progress Notes (Signed)
   Subjective:    Patient ID: Xavier Simmer., male    DOB: 1991/07/22, 27 y.o.   MRN: [id number]  HPI  Here to f/u allergy and asthma, has been struggling and out of meds for more than 1 mo, moderate, constant, worse with being outside, better inside; Does have several wks ongoing nasal allergy symptoms with clearish congestion, itch and sneezing with intermittent sob and wheezing, without fever, pain, ST, swelling.   Pt denies polydipsia, polyuria.  .Pt denies new neurological symptoms such as new headache, or facial or extremity weakness or numbness Past Medical History:  Diagnosis Date  . Allergic rhinitis, cause unspecified    No past surgical history on file.  reports that he has never smoked. He has never used smokeless tobacco. He reports that he does not drink alcohol or use drugs. family history includes Arthritis in his other; Cancer in his maternal grandfather; Diabetes in his maternal grandfather; Hypertension in his other; Lupus in his mother. No Known Allergies Current Outpatient Medications on File Prior to Visit  Medication Sig Dispense Refill  . hydrocortisone (ANUSOL-HC) 2.5 % rectal cream Place 1 application rectally 2 (two) times daily as needed for hemorrhoids or itching. 30 g 1   No current facility-administered medications on file prior to visit.    Review of Systems  Constitutional: Negative for other unusual diaphoresis or sweats HENT: Negative for ear discharge or swelling Eyes: Negative for other worsening visual disturbances Respiratory: Negative for stridor or other swelling  Gastrointestinal: Negative for worsening distension or other blood Genitourinary: Negative for retention or other urinary change Musculoskeletal: Negative for other MSK pain or swelling Skin: Negative for color change or other new lesions Neurological: Negative for worsening tremors and other numbness  Psychiatric/Behavioral: Negative for worsening agitation or other fatigue All other  system neg per pt    Objective:   Physical Exam BP 118/76   Pulse 76   Temp 98 F (36.7 C) (Oral)   Ht  (1.778 m)   Wt 183 lb (83 kg)   SpO2 98%   BMI 26.26 kg/m  VS noted, not ill appearing Constitutional: Pt appears in NAD HENT: Head: NCAT.  Right Ear: External ear normal.  Left Ear: External ear normal.  Eyes: . Pupils are equal, round, and reactive to light. Conjunctivae and EOM are normal Bilat tm's with mild erythema.  Max sinus areas non tender.  Pharynx with mild erythema, no exudate Nose: without d/c or deformity Neck: Neck supple. Gross normal ROM Cardiovascular: Normal rate and regular rhythm.   Pulmonary/Chest: Effort normal and breath sounds decreased without rales or wheezing.  Neurological: Pt is alert. At baseline orientation, motor grossly intact Skin: Skin is warm. No rashes, other new lesions, no LE edema Psychiatric: Pt behavior is normal without agitation         Assessment & Plan:

## 2018-04-26 NOTE — Patient Instructions (Addendum)
You had the steroid shot today  Please take all new medication as prescribed - the prednisone, zyrtec, nasacort, albuterol inhaler, and Symbicort  Please continue all other medications as before, and refills have been done if requested.  Please have the pharmacy call with any other refills you may need.  Please keep your appointments with your specialists as you may have planned

## 2018-04-28 DIAGNOSIS — J45909 Unspecified asthma, uncomplicated: Secondary | ICD-10-CM | POA: Insufficient documentation

## 2018-04-28 NOTE — Assessment & Plan Note (Signed)
Mild to mod, for predpac asd, zyrtec and nasacort, asd, to f/u any worsening symptoms or concerns

## 2018-04-28 NOTE — Assessment & Plan Note (Addendum)
Mild exacerbation, albuterol MDI prn, and symbicort asd after depomedrol IM 80,  to f/u any worsening symptoms or concerns

## 2018-10-05 ENCOUNTER — Ambulatory Visit (INDEPENDENT_AMBULATORY_CARE_PROVIDER_SITE_OTHER): Payer: 59 | Admitting: Family

## 2018-10-05 ENCOUNTER — Other Ambulatory Visit (INDEPENDENT_AMBULATORY_CARE_PROVIDER_SITE_OTHER): Payer: 59

## 2018-10-05 ENCOUNTER — Encounter: Payer: Self-pay | Admitting: Family

## 2018-10-05 VITALS — BP 112/76 | HR 102 | Temp 97.9°F | Ht 70.0 in | Wt 181.0 lb

## 2018-10-05 DIAGNOSIS — Z113 Encounter for screening for infections with a predominantly sexual mode of transmission: Secondary | ICD-10-CM | POA: Diagnosis not present

## 2018-10-05 DIAGNOSIS — R109 Unspecified abdominal pain: Secondary | ICD-10-CM

## 2018-10-05 LAB — CBC WITH DIFFERENTIAL/PLATELET
BASOS PCT: 1.1 % (ref 0.0–3.0)
Basophils Absolute: 0 10*3/uL (ref 0.0–0.1)
Eosinophils Absolute: 0.1 10*3/uL (ref 0.0–0.7)
Eosinophils Relative: 1.5 % (ref 0.0–5.0)
HCT: 48.1 % (ref 39.0–52.0)
Hemoglobin: 16 g/dL (ref 13.0–17.0)
LYMPHS ABS: 1.7 10*3/uL (ref 0.7–4.0)
Lymphocytes Relative: 42.2 % (ref 12.0–46.0)
MCHC: 33.4 g/dL (ref 30.0–36.0)
MCV: 82.2 fl (ref 78.0–100.0)
MONO ABS: 0.4 10*3/uL (ref 0.1–1.0)
Monocytes Relative: 10.2 % (ref 3.0–12.0)
NEUTROS PCT: 45 % (ref 43.0–77.0)
Neutro Abs: 1.8 10*3/uL (ref 1.4–7.7)
PLATELETS: 285 10*3/uL (ref 150.0–400.0)
RBC: 5.85 Mil/uL — ABNORMAL HIGH (ref 4.22–5.81)
RDW: 13.7 % (ref 11.5–15.5)
WBC: 4.1 10*3/uL (ref 4.0–10.5)

## 2018-10-05 LAB — URINALYSIS
BILIRUBIN URINE: NEGATIVE
Ketones, ur: NEGATIVE
Leukocytes, UA: NEGATIVE
Nitrite: NEGATIVE
Specific Gravity, Urine: 1.01 (ref 1.000–1.030)
TOTAL PROTEIN, URINE-UPE24: NEGATIVE
URINE GLUCOSE: NEGATIVE
Urobilinogen, UA: 0.2 (ref 0.0–1.0)
pH: 7 (ref 5.0–8.0)

## 2018-10-05 LAB — COMPREHENSIVE METABOLIC PANEL
ALT: 33 U/L (ref 0–53)
AST: 22 U/L (ref 0–37)
Albumin: 5 g/dL (ref 3.5–5.2)
Alkaline Phosphatase: 45 U/L (ref 39–117)
BUN: 12 mg/dL (ref 6–23)
CHLORIDE: 97 meq/L (ref 96–112)
CO2: 32 mEq/L (ref 19–32)
Calcium: 9.9 mg/dL (ref 8.4–10.5)
Creatinine, Ser: 1.19 mg/dL (ref 0.40–1.50)
GFR: 94.69 mL/min (ref >60.00)
GLUCOSE: 119 mg/dL — AB (ref 70–99)
POTASSIUM: 3.3 meq/L — AB (ref 3.5–5.1)
SODIUM: 137 meq/L (ref 135–145)
TOTAL PROTEIN: 8.1 g/dL (ref 6.0–8.3)
Total Bilirubin: 1 mg/dL (ref 0.2–1.2)

## 2018-10-05 NOTE — Progress Notes (Signed)
Xavier Tutterow. is a 27 y.o. male with the following history as recorded in [employer]:  Patient Active Problem List   Diagnosis Date Noted  . Asthma 04/28/2018  . Constipation 07/08/2016  . Right knee pain 05/29/2015  . DOE (dyspnea on exertion) 06/10/2013  . Venereal disease, unspecified 11/02/2012  . Allergic rhinitis   . External hemorrhoid, thrombosed 10/24/2012  . Osgood-Schlatter's disease 10/28/2011  . Preventative health care 10/22/2011    No current outpatient medications on file.   No current facility-administered medications for this visit.     Allergies: Patient has no known allergies.  Past Medical History:  Diagnosis Date  . Allergic rhinitis, cause unspecified     History reviewed. No pertinent surgical history.  Family History  Problem Relation Age of Onset  . Arthritis Other   . Hypertension Other   . Lupus Mother   . Cancer Maternal Grandfather   . Diabetes Maternal Grandfather     Social History   Tobacco Use  . Smoking status: Never Smoker  . Smokeless tobacco: Never Used  Substance Use Topics  . Alcohol use: No    Subjective:  Patient presents with vague symptoms of abdominal/ low back pain "on and off" for the past 1-2 weeks; seem to intensify after this past weekend- increased amount of alcohol celebrating homecoming; denies any changes in bowel or bladder habits; no concerns for recent exposure to STD but would like to get his tests updated today; does note that symptoms are much better today;    Objective:  Vitals:   10/05/18 1503  BP: 112/76  Pulse: (!) 102  Temp: 97.9 F (36.6 C)  TempSrc: Oral  SpO2: 99%  Weight: 181 lb (82.1 kg)  Height: 5' 10"  (1.778 m)    General: Well developed, well nourished, in no acute distress  Skin : Warm and dry.  Head: Normocephalic and atraumatic  Eyes: Sclera and conjunctiva clear; pupils round and reactive to light; extraocular movements intact  Ears: External normal; canals clear; tympanic membranes  normal  Oropharynx: Pink, supple. No suspicious lesions  Neck: Supple without thyromegaly, adenopathy  Lungs: Respirations unlabored; clear to auscultation bilaterally without wheeze, rales, rhonchi  CVS exam: normal rate and regular rhythm.  Abdomen: Soft; nontender; nondistended; normoactive bowel sounds; no masses or hepatosplenomegaly  Musculoskeletal: No deformities; no active joint inflammation  Extremities: No edema, cyanosis, clubbing  Vessels: Symmetric bilaterally  Neurologic: Alert and oriented; speech intact; face symmetrical; moves all extremities well; CNII-XII intact without focal deficit   Assessment:  1. Screen for STD (sexually transmitted disease)   2. Abdominal pain, unspecified abdominal location     Plan:  1. Will update labs today; 2. Physical exam is reassuring; ? If patient got dehydrated this past weekend; check CBC, CMP, U/A today; follow-up to be determined.   No follow-ups on file.  Orders Placed This Encounter  Procedures  . GC/Chlamydia Probe Amp([facility])    Standing Status:   Future    Standing Expiration Date:   10/06/2019  . CBC w/Diff    Standing Status:   Future    Standing Expiration Date:   10/05/2019  . Comp Met (CMET)    Standing Status:   Future    Standing Expiration Date:   10/05/2019  . Urinalysis    Standing Status:   Future    Standing Expiration Date:   10/05/2019  . HIV Antibody (routine testing w rflx)    Standing Status:   Future    Standing  Expiration Date:   10/06/2019  . RPR    Standing Status:   Future    Standing Expiration Date:   10/05/2019    Requested Prescriptions    No prescriptions requested or ordered in this encounter

## 2018-10-07 LAB — GC/CHLAMYDIA PROBE AMP
CHLAMYDIA, DNA PROBE: NEGATIVE
Neisseria gonorrhoeae by PCR: NEGATIVE

## 2018-10-07 LAB — RPR: RPR Ser Ql: NONREACTIVE

## 2018-10-07 LAB — HIV ANTIBODY (ROUTINE TESTING W REFLEX): HIV: NONREACTIVE

## 2018-10-08 ENCOUNTER — Telehealth: Payer: Self-pay | Admitting: Internal Medicine

## 2018-10-08 MED ORDER — DOXYCYCLINE HYCLATE 100 MG PO TABS: 100.0000 mg | ORAL_TABLET | Freq: Two times a day (BID) | ORAL | 0 refills | Status: AC

## 2018-10-08 NOTE — Telephone Encounter (Signed)
I see where he read his note already that you sent him. Was there anything else you would advise.  Thanks

## 2018-10-08 NOTE — Telephone Encounter (Signed)
Copied from CRM [id number]. Topic: Quick Communication - See Telephone Encounter >> Oct 08, 2018 12:20 PM Lorrine Kin, [place] wrote: CRM for notification. See Telephone encounter for: 10/08/18. Patient calling and states that he was just seen on Feels like bladder is inflamed. Pressure. Tingling when urinating.

## 2018-10-08 NOTE — Telephone Encounter (Signed)
I am going to send in Doxycycline for him to take for 7 days; will try treating for something called urethritis; if the symptoms persist after this treatment, we will need to get further imaging done and maybe have him see a urologist.

## 2018-10-08 NOTE — Telephone Encounter (Signed)
Message sent to patient via mychart

## 2019-02-26 ENCOUNTER — Telehealth: Payer: Self-pay | Admitting: *Deleted

## 2019-02-26 NOTE — Telephone Encounter (Signed)
lmovm for to return call.

## 2019-02-26 NOTE — Telephone Encounter (Signed)
Pt scheduled  

## 2019-02-26 NOTE — Telephone Encounter (Signed)
Copied from CRM [id number]. Topic: Appointment Scheduling - Scheduling Inquiry for Clinic >> Feb 26, 2019  9:49 AM Marylen Ponto wrote: Reason for CRM: Pt stated he would like to schedule an appt because he is experiencing some pain in right shoulder as well as pain in his right foot due to stress fracture. Cb# [phone] >> Feb 26, 2019 10:11 AM Morphies, Hermine Messick wrote: Pt has been seen by Dr Katrinka Blazing in the past.

## 2019-02-27 ENCOUNTER — Ambulatory Visit: Payer: 59 | Admitting: Family Medicine

## 2019-02-27 ENCOUNTER — Encounter: Payer: Self-pay | Admitting: Family Medicine

## 2019-02-27 ENCOUNTER — Ambulatory Visit: Payer: Self-pay

## 2019-02-27 ENCOUNTER — Other Ambulatory Visit: Payer: Self-pay

## 2019-02-27 VITALS — BP 110/72 | HR 65 | Ht 70.0 in | Wt 189.0 lb

## 2019-02-27 DIAGNOSIS — M67479 Ganglion, unspecified ankle and foot: Secondary | ICD-10-CM | POA: Diagnosis not present

## 2019-02-27 DIAGNOSIS — M7551 Bursitis of right shoulder: Secondary | ICD-10-CM | POA: Insufficient documentation

## 2019-02-27 DIAGNOSIS — G8929 Other chronic pain: Secondary | ICD-10-CM

## 2019-02-27 DIAGNOSIS — M25511 Pain in right shoulder: Secondary | ICD-10-CM

## 2019-02-27 MED ORDER — DICLOFENAC SODIUM 2 % TD SOLN: 2.0000 g | Freq: Two times a day (BID) | TRANSDERMAL | 3 refills | Status: DC

## 2019-02-27 NOTE — Assessment & Plan Note (Signed)
Foot exam shows a mild ganglion cyst.  Do not need any intervention at this moment.  Discussed which activities of doing which wants to avoid.  Follow-up again in 4 to 8 weeks.

## 2019-02-27 NOTE — Assessment & Plan Note (Signed)
Shoulder bursitis.  Discussed with patient home exercise, icing regimen, which activities to do which wants to avoid.  Patient states that he has had pain for approximately 4 years, worsening at the moment.  Affecting daily activities.  Patient states that unfortunately has done formal physical therapy and all other conservative therapy options at this time.  Patient does not want to be having significant number of follow-ups at the moment secondary to the corona virus outbreak.  We discussed with patient at great length.  Patient wanted injection.  Warned of potential side effects and with him being young that this is usually not our first option.  Patient was in agreement and understood and did have the injection today.  Work with Event organiser to learn home exercises in greater detail.  Follow-up again over the virtual visit in 4 weeks.

## 2019-02-27 NOTE — Progress Notes (Addendum)
[name] [address] [place], [place] [address] Phone: [phone] Subjective:   Xavier Cochran, am serving as a scribe for Dr. Antoine Primas.  Patient was referred for further evaluation by [name], MD  CC: Right shoulder pain, right foot pain  HFW:[id number]  [name]. is a 28 y.o. male coming in with complaint of right shoulder pain. Patient dislocated shoulder twice throughout his life. [place] airport. Patient has pain in anterior and posterior shoulder. Pain is fairly constant. Uses IBU and muscle rub. Denies any radiating symptoms.   Also having right great toe pain at the metatarsal head. Pain with weight bearing. Pain has been occurring 4 weeks ago with pain increasing over the past 2 weeks. Patient has been wearing nike shoes at work with a slide on steel toe. Feels that he may have a stress fracture.     Past Medical History:  Diagnosis Date  . Allergic rhinitis, cause unspecified    No past surgical history on file. Social History   Socioeconomic History  . Marital status: Single    Spouse name: Not on file  . Number of children: Not on file  . Years of education: Not on file  . Highest education level: Not on file  Occupational History  . Occupation: Consulting civil engineer  Social Needs  . Financial resource strain: Not on file  . Food insecurity:    Worry: Not on file    Inability: Not on file  . Transportation needs:    Medical: Not on file    Non-medical: Not on file  Tobacco Use  . Smoking status: Never Smoker  . Smokeless tobacco: Never Used  Substance and Sexual Activity  . Alcohol use: No  . Drug use: No  . Sexual activity: Not on file  Lifestyle  . Physical activity:    Days per week: Not on file    Minutes per session: Not on file  . Stress: Not on file  Relationships  . Social connections:    Talks on phone: Not on file    Gets together: Not on file    Attends religious service: Not on  file    Active member of club or organization: Not on file    Attends meetings of clubs or organizations: Not on file    Relationship status: Not on file  Other Topics Concern  . Not on file  Social History Narrative  . Not on file   No Known Allergies Family History  Problem Relation Age of Onset  . Arthritis Other   . Hypertension Other   . Lupus Mother   . Cancer Maternal Grandfather   . Diabetes Maternal Grandfather          Current Outpatient Medications (Other):  [name]  Diclofenac Sodium 2 % SOLN, Place 2 g onto the skin 2 (two) times daily.    Past medical history, social, surgical and family history all reviewed in electronic medical record.  No pertanent information unless stated regarding to the chief complaint.   Review of Systems:  No headache, visual changes, nausea, vomiting, diarrhea, constipation, dizziness, abdominal pain, skin rash, fevers, chills, night sweats, weight loss, swollen lymph nodes, body aches, joint swelling, muscle aches, chest pain, shortness of breath, mood changes.   Objective  Blood pressure 110/72, pulse 65, height 5\' 10"  (1.778 m), weight 189 lb (85.7 kg), SpO2 99 %.   General: No apparent distress alert and oriented x3  mood and affect normal, dressed appropriately.  HEENT: Pupils equal, extraocular movements intact  Respiratory: Patient's speak in full sentences and does not appear short of breath  Cardiovascular: No lower extremity edema, non tender, no erythema  Skin: Warm dry intact with no signs of infection or rash on extremities or on axial skeleton.  Abdomen: Soft nontender  Neuro: Cranial nerves II through XII are intact, neurovascularly intact in all extremities with 2+ DTRs and 2+ pulses.  Lymph: No lymphadenopathy of posterior or anterior cervical chain or axillae bilaterally.  Gait normal with good balance and coordination.  MSK:  Non tender with full range of motion and good stability and symmetric strength and tone of  shoulders, elbows, wrist, hip, knee and ankles bilaterally.  Shoulder: Right Inspection reveals no abnormalities, atrophy or asymmetry. Palpation is normal with no tenderness over AC joint or bicipital groove. ROM is full in all planes passively. Rotator cuff strength normal throughout. signs of impingement with positive Neer and Hawkin's tests, but negative empty can sign. Speeds and Yergason's tests normal. Positive O'Brien's Normal scapular function observed. No painful arc and no drop arm sign. No apprehension sign Contralateral shoulder unremarkable  MSK US performed of: Right This study was ordered, performed, and interpreted by Terrilee Files D.O.  Shoulder:   Supraspinatus:  Appears normal on long and transverse views, Bursal bulge seen with shoulder abduction on impingement view. Infraspinatus:  Appears normal on long and transverse views. Significant increase in Doppler flow Subscapularis:  Appears normal on long and transverse views. Positive bursa Teres Minor:  Appears normal on long and transverse views. AC joint: Trace swelling noted Glenohumeral Joint:  Appears normal without effusion. Glenoid Labrum:  Intact without visualized tears. Biceps Tendon:  Appears normal on long and transverse views, no fraying of tendon, tendon located in intertubercular groove, no subluxation with shoulder internal or external rotation.  Impression: Subacromial bursitis, some mild chronic changes from previous shoulder dislocation  Limited musculoskeletal ultrasound was also performed patient's right foot showing a possible ganglion cyst in the extensor tendon over the first metatarsal.  Small in nature.  Seems to be freely movable.  Procedure: Real-time Ultrasound Guided Injection of right glenohumeral joint Device: GE Logiq E  Ultrasound guided injection is preferred based studies that show increased duration, increased effect, greater accuracy, decreased procedural pain, increased response  rate with ultrasound guided versus blind injection.  Verbal informed consent obtained.  Time-out conducted.  Noted no overlying erythema, induration, or other signs of local infection.  Skin prepped in a sterile fashion.  Local anesthesia: Topical Ethyl chloride.  With sterile technique and under real time ultrasound guidance:  Joint visualized.  23g 1  inch needle inserted posterior approach. Pictures taken for needle placement. Patient did have injection of 2 cc of 1% lidocaine, 2 cc of 0.5% Marcaine, and 1.0 cc of Kenalog 40 mg/dL. Completed without difficulty  Pain immediately resolved suggesting accurate placement of the medication.  Advised to call if fevers/chills, erythema, induration, drainage, or persistent bleeding.  Images permanently stored and available for review in the ultrasound unit.  Impression: Technically successful ultrasound guided injection.   [id number]; 15 additional minutes spent for Therapeutic exercises as stated in above notes.  This included exercises focusing on stretching, strengthening, with significant focus on eccentric aspects.   Long term goals include an improvement in range of motion, strength, endurance as well as avoiding reinjury. Patient's frequency would include in 1-2 times a day, 3-5 times a week for a duration of  6-12 weeks. Shoulder Exercises that included:  Basic scapular stabilization to include adduction and depression of scapula Scaption, focusing on proper movement and good control Internal and External rotation utilizing a theraband, with elbow tucked at side entire time Rows with theraband given   Proper technique shown and discussed handout in great detail with ATC.  All questions were discussed and answered.      Impression and Recommendations:     This case required medical decision making of moderate complexity. The above documentation has been reviewed and is accurate and complete Judi Saa, DO       Note: This dictation  was prepared with Dragon dictation along with smaller phrase technology. Any transcriptional errors that result from this process are unintentional.

## 2019-02-27 NOTE — Patient Instructions (Signed)
Good to see you  Ice 20 minutes 2 times daily. Usually after activity and before bed. Exercises 3 times a week.  Keep hands within peripheral vision  Injected the shoulder they will call you on the brace pennsaid pinkie amount topically 2 times daily as needed.  For the foot do not lace the last eye on shoe [employer] orthotics "total support" online would be great  Avoid walking barefoot too much. Appears to be a ganglion cyst  We will do a web visit in 4 week

## 2019-03-05 ENCOUNTER — Encounter: Payer: Self-pay | Admitting: Family

## 2019-03-06 ENCOUNTER — Ambulatory Visit (INDEPENDENT_AMBULATORY_CARE_PROVIDER_SITE_OTHER): Payer: 59 | Admitting: Internal Medicine

## 2019-03-06 ENCOUNTER — Encounter: Payer: Self-pay | Admitting: Internal Medicine

## 2019-03-06 DIAGNOSIS — K649 Unspecified hemorrhoids: Secondary | ICD-10-CM | POA: Diagnosis not present

## 2019-03-06 DIAGNOSIS — K5900 Constipation, unspecified: Secondary | ICD-10-CM | POA: Diagnosis not present

## 2019-03-06 DIAGNOSIS — N410 Acute prostatitis: Secondary | ICD-10-CM | POA: Diagnosis not present

## 2019-03-06 MED ORDER — HYDROCORTISONE ACETATE 25 MG RE SUPP: 25.0000 mg | Freq: Two times a day (BID) | RECTAL | 1 refills | Status: AC

## 2019-03-06 MED ORDER — DOXYCYCLINE HYCLATE 100 MG PO TABS: 100.0000 mg | ORAL_TABLET | Freq: Two times a day (BID) | ORAL | 0 refills | Status: DC

## 2019-03-06 NOTE — Progress Notes (Signed)
Patient ID: Xavier Hulme., male   DOB: [birth year], 28 y.o.   MRN: [id number]   Virtual Visit via Video Note  I connected with Xavier Cochran. on 03/06/19 at 10:40 AM EDT by a video enabled telemedicine application and verified that I am speaking with the correct person using two identifiers. I am at the office, pt is in his car, and no other persons present   I discussed the limitations of evaluation and management by telemedicine and the availability of in person appointments. The patient expressed understanding and agreed to proceed.  History of Present Illness: Here with urinary complaints with unusual decreased urinary flow, end urination burning discomfort, and vague dull mid lower abd discomfort for 1 wk, mild, constant, no radiation and Denies worsening reflux, dysphagia, n/v, or blood. Has had constipation recently, plans to start OTC colace soon and wondering if related to the urinary issues.  Now with hemorroidal discomfort as well for 1 wk, but no blood or drainage. Denies any STD like symptoms and no recent sexual contacts.  No other new complaints Past Medical History:  Diagnosis Date  . Allergic rhinitis, cause unspecified    No past surgical history on file.  reports that he has never smoked. He has never used smokeless tobacco. He reports that he does not drink alcohol or use drugs. family history includes Arthritis in an other family member; Cancer in his maternal grandfather; Diabetes in his maternal grandfather; Hypertension in an other family member; Lupus in his mother. No Known Allergies Current Outpatient Medications on File Prior to Visit  Medication Sig Dispense Refill  . Diclofenac Sodium 2 % SOLN Place 2 g onto the skin 2 (two) times daily. 112 g 3   No current facility-administered medications on file prior to visit.     Observations/Objective: Mild ill, uncomfortable appearing sitting in car, alert, normal resps, cn 2-12 intact, moves all 4s Lab Results  Component  Value Date   WBC 4.1 10/05/2018   HGB 16.0 10/05/2018   HCT 48.1 10/05/2018   PLT 285.0 10/05/2018   GLUCOSE 119 (H) 10/05/2018   CHOL 128 11/02/2012   TRIG 46.0 11/02/2012   HDL 47.10 11/02/2012   LDLCALC 72 11/02/2012   ALT 33 10/05/2018   AST 22 10/05/2018   NA 137 10/05/2018   K 3.3 (L) 10/05/2018   CL 97 10/05/2018   CREATININE 1.19 10/05/2018   BUN 12 10/05/2018   CO2 32 10/05/2018   TSH 2.92 11/02/2012   Assessment and Plan: See notes  Follow Up Instructions: See notes   I discussed the assessment and treatment plan with the patient. The patient was provided an opportunity to ask questions and all were answered. The patient agreed with the plan and demonstrated an understanding of the instructions.   The patient was advised to call back or seek an in-person evaluation if the symptoms worsen or if the condition fails to improve as anticipated.  [name], MD

## 2019-03-06 NOTE — Assessment & Plan Note (Signed)
Agree and ok for otc colace 100 bid prn, to f/u any worsening symptoms or concerns

## 2019-03-06 NOTE — Assessment & Plan Note (Signed)
Presumed as we are not able for DRE exam due to pandemic issue and trying to avoid office visit; ok for doxy x 3 wks

## 2019-03-06 NOTE — Telephone Encounter (Signed)
Xavier Cochran, please put him on a virtual visit with Dr. Jonny Ruiz?

## 2019-03-06 NOTE — Patient Instructions (Addendum)
Please take all new medication as prescribed - the antibiotic, and the suppository as needed  OK to start the colace 100 mg as needed  Please continue all other medications as before, and refills have been done if requested.  Please have the pharmacy call with any other refills you may need.  Please continue your efforts at being more active, low cholesterol diet, and weight control.  Please keep your appointments with your specialists as you may have planned

## 2019-03-06 NOTE — Assessment & Plan Note (Signed)
Ok for anusol HC bid prn,  to f/u any worsening symptoms or concerns

## 2019-03-06 NOTE — Telephone Encounter (Signed)
Virtual Visit scheduled today with Dr Jonny Ruiz.

## 2019-03-07 ENCOUNTER — Encounter: Payer: Self-pay | Admitting: Internal Medicine

## 2019-03-08 ENCOUNTER — Ambulatory Visit: Payer: Self-pay

## 2019-03-08 NOTE — Telephone Encounter (Signed)
Call place to patient. Left VM to return call to office. 

## 2019-03-08 NOTE — Telephone Encounter (Signed)
Patient states he has been experiencing nausea with no vomtting while taking prescribed antibiotics. Pt states on the first day he did not experience any symptoms but on the 2nd day an hour to two hours after taking the antibiotic he began to feel nauseous. Pt states he has not taken any of the medication today but states he did try some leftover antibiotics that were prescribed to his mom previously and did not experience nausea. Pt advised not to take medication that was not prescribed to him and also explained that not taking a full course of antibiotics could develop a resistance to future treatments with antibiotics. Pt given home care advice and advised with worsening symptoms to seek treatment at Urgent Care or have a evisit via MyChart. Pt verbalized understanding.   Reason for Disposition . Taking prescription medication that could cause nausea (e.g., narcotics/opiates, antibiotics, OCPs, many others)  Answer Assessment - Initial Assessment Questions 1. NAUSEA SEVERITY: "How bad is the nausea?" (e.g., mild, moderate, severe; dehydration, weight loss)   - MILD: loss of appetite without change in eating habits   - MODERATE: decreased oral intake without significant weight loss, dehydration, or malnutrition   - SEVERE: inadequate caloric or fluid intake, significant weight loss, symptoms of dehydration     Yesterday was an 8 but does not feel  2. ONSET: "When did the nausea begin?"     First day  3. VOMITING: "Any vomiting?" If so, ask: "How many times today?"     No vomitting 4. RECURRENT SYMPTOM: "Have you had nausea before?" If so, ask: "When was the last time?" "What happened that time?"     Taking antibiotics 5. CAUSE: "What do you think is causing the nausea?"     Taking antibiotics 6. PREGNANCY: "Is there any chance you are pregnant?" (e.g., unprotected intercourse, missed birth control pill, broken condom)  Protocols used: NAUSEA-A-AH

## 2019-03-11 ENCOUNTER — Telehealth: Payer: Self-pay

## 2019-03-11 MED ORDER — CIPROFLOXACIN HCL 500 MG PO TABS: 500.0000 mg | ORAL_TABLET | Freq: Two times a day (BID) | ORAL | 0 refills | Status: AC

## 2019-03-11 NOTE — Telephone Encounter (Signed)
Left message for patient to come in to get fitted for Sully brace.

## 2019-03-11 NOTE — Telephone Encounter (Signed)
Noted  

## 2019-03-11 NOTE — Telephone Encounter (Signed)
Schedule to come in tomorrow for brace.

## 2019-03-11 NOTE — Telephone Encounter (Signed)
Ok for change doxy to cipro - done erx

## 2019-03-20 ENCOUNTER — Other Ambulatory Visit: Payer: Self-pay | Admitting: Internal Medicine

## 2019-03-26 NOTE — Progress Notes (Signed)
  [name] [address] [place], [place] [address] Phone: [phone] Subjective:    Virtual Visit via Video Note  I connected with Xavier Cochran. on 03/26/19 at  1:45 PM EDT by a video enabled telemedicine application and verified that I am speaking with the correct person using two identifiers.   I discussed the limitations of evaluation and management by telemedicine and the availability of in person appointments. The patient expressed understanding and agreed to proceed.  Patient is at home and I was in the office and inform virtual plan from December    I discussed the assessment and treatment plan with the patient. The patient was provided an opportunity to ask questions and all were answered. The patient agreed with the plan and demonstrated an understanding of the instructions.   The patient was advised to call back or seek an in-person evaluation if the symptoms worsen or if the condition fails to improve as anticipated.  I provided 0 minutes of -face-to-face time during this encounter.   Judi Saa, DO

## 2019-03-27 ENCOUNTER — Encounter: Payer: 59 | Admitting: Family Medicine

## 2019-03-27 ENCOUNTER — Encounter: Payer: Self-pay | Admitting: Family Medicine

## 2019-05-07 ENCOUNTER — Ambulatory Visit (INDEPENDENT_AMBULATORY_CARE_PROVIDER_SITE_OTHER): Payer: 59 | Admitting: Family Medicine

## 2019-05-07 ENCOUNTER — Other Ambulatory Visit: Payer: Self-pay

## 2019-05-07 ENCOUNTER — Encounter: Payer: Self-pay | Admitting: Family Medicine

## 2019-05-07 DIAGNOSIS — M67479 Ganglion, unspecified ankle and foot: Secondary | ICD-10-CM

## 2019-05-07 NOTE — Patient Instructions (Signed)
Good to se eyou  Ice is your friend Carbon fiber foot plate in your shoe Tried the injection and I really hope it helps See me again in 4 weeks

## 2019-05-07 NOTE — Assessment & Plan Note (Signed)
Patient given injection.  Tolerated the procedure well.  Discussed topical anti-inflammatories, proper shoes.  May need to make custom orthotics.  Follow-up again in 4 to 8 weeks

## 2019-05-07 NOTE — Progress Notes (Signed)
[facility] [address] [place], [place] [address] Phone: [phone] Subjective:   I Ronelle NighKana Thompson am serving as a Neurosurgeonscribe for Dr. Antoine PrimasZachary Mitul Hallowell.   CC: foot pain follow up   XLK:[id number]:Subjective  Xavier Simmervan Newsom Jr. is a 28 y.o. male coming in with complaint of right foot pain. States the shoulder is doing well. Believes the cyst in his foot is still bothering him. Pain with walking.  Patient states unable to increase activity secondary to the discomfort and pain.  Found to have a ganglion cyst in this area and continues to have pain.    Past Medical History:  Diagnosis Date  . Allergic rhinitis, cause unspecified    No past surgical history on file. Social History   Socioeconomic History  . Marital status: Single    Spouse name: Not on file  . Number of children: Not on file  . Years of education: Not on file  . Highest education level: Not on file  Occupational History  . Occupation: Consulting civil engineerstudent  Social Needs  . Financial resource strain: Not on file  . Food insecurity:    Worry: Not on file    Inability: Not on file  . Transportation needs:    Medical: Not on file    Non-medical: Not on file  Tobacco Use  . Smoking status: Never Smoker  . Smokeless tobacco: Never Used  Substance and Sexual Activity  . Alcohol use: No  . Drug use: No  . Sexual activity: Not on file  Lifestyle  . Physical activity:    Days per week: Not on file    Minutes per session: Not on file  . Stress: Not on file  Relationships  . Social connections:    Talks on phone: Not on file    Gets together: Not on file    Attends religious service: Not on file    Active member of club or organization: Not on file    Attends meetings of clubs or organizations: Not on file    Relationship status: Not on file  Other Topics Concern  . Not on file  Social History Narrative  . Not on file   Allergies  Allergen Reactions  . Doxycycline Nausea Only   Family History  Problem  Relation Age of Onset  . Arthritis Other   . Hypertension Other   . Lupus Mother   . Cancer Maternal Grandfather   . Diabetes Maternal Grandfather          Current Outpatient Medications (Other):  [name].  Diclofenac Sodium 2 % SOLN, Place 2 g onto the skin 2 (two) times daily. .  hydrocortisone (ANUSOL-HC) 25 MG suppository, Place 1 suppository (25 mg total) rectally every 12 (twelve) hours.    Past medical history, social, surgical and family history all reviewed in electronic medical record.  No pertanent information unless stated regarding to the chief complaint.   Review of Systems:  No headache, visual changes, nausea, vomiting, diarrhea, constipation, dizziness, abdominal pain, skin rash, fevers, chills, night sweats, weight loss, swollen lymph nodes, body aches, joint swelling, muscle aches, chest pain, shortness of breath, mood changes.   Objective  Blood pressure 100/82, pulse 89, height 5\' 10"  (1.778 m), weight 189 lb (85.7 kg), SpO2 97 %. Systems examined below as of    General: No apparent distress alert and oriented x3 mood and affect normal, dressed appropriately.  HEENT: Pupils equal, extraocular movements intact  Respiratory: Patient's speak in full sentences and  does not appear short of breath  Cardiovascular: No lower extremity edema, non tender, no erythema  Skin: Warm dry intact with no signs of infection or rash on extremities or on axial skeleton.  Abdomen: Soft nontender  Neuro: Cranial nerves II through XII are intact, neurovascularly intact in all extremities with 2+ DTRs and 2+ pulses.  Lymph: No lymphadenopathy of posterior or anterior cervical chain or axillae bilaterally.  Gait normal with good balance and coordination.  MSK:  Non tender with full range of motion and good stability and symmetric strength and tone of shoulders, elbows, wrist, hip, knee and ankles bilaterally.  Foot exam shows no significant breakdown.  Does have have some hallux limitus  noted.  Patient is tender on the plantar aspect of the first toe at the IP joint.  Procedure: Real-time Ultrasound Guided Injection of right first toe ganglion cyst Device: GE Logiq Q7 Ultrasound guided injection is preferred based studies that show increased duration, increased effect, greater accuracy, decreased procedural pain, increased response rate, and decreased cost with ultrasound guided versus blind injection.  Verbal informed consent obtained.  Time-out conducted.  Noted no overlying erythema, induration, or other signs of local infection.  Skin prepped in a sterile fashion.  Local anesthesia: Topical Ethyl chloride.  With sterile technique and under real time ultrasound guidance: With a 25-gauge was injected with 0.5 cc of 0.5% Marcaine and 0.5 cc of Kenalog 40 mg/mL Completed without difficulty  Pain immediately resolved suggesting accurate placement of the medication.  Advised to call if fevers/chills, erythema, induration, drainage, or persistent bleeding.  Images permanently stored and available for review in the ultrasound unit.  Impression: Technically successful ultrasound guided injection.    Impression and Recommendations:     This case required medical decision making of moderate complexity. The above documentation has been reviewed and is accurate and complete Lyndal Pulley, DO       Note: This dictation was prepared with Dragon dictation along with smaller phrase technology. Any transcriptional errors that result from this process are unintentional.

## 2019-06-04 ENCOUNTER — Ambulatory Visit: Payer: 59 | Admitting: Family Medicine

## 2019-06-04 ENCOUNTER — Encounter: Payer: Self-pay | Admitting: Family Medicine

## 2019-06-04 ENCOUNTER — Other Ambulatory Visit: Payer: Self-pay

## 2019-06-04 DIAGNOSIS — M67479 Ganglion, unspecified ankle and foot: Secondary | ICD-10-CM

## 2019-06-04 NOTE — Progress Notes (Signed)
[facility] [place], [place] [address] Phone: [phone] Subjective:   I Xavier Cochran am serving as a Education administrator for Dr. Hulan Saas.  I'm seeing this patient by the request  of:    CC: Right foot pain follow-up  [id number]   05/07/2019 Patient given injection.  Tolerated the procedure well.  Discussed topical anti-inflammatories, proper shoes.  May need to make custom orthotics.  Follow-up again in 4 to 8 weeks  06/04/2019 Xavier Cochran. is a 28 y.o. male coming in with complaint of foot pain. Still some pain with the big toe. Lateral foot pain due to compensation.  Overall the patient has been doing relatively well.  Still some mild discomfort from time to time but is about 80% better.     Past Medical History:  Diagnosis Date  . Allergic rhinitis, cause unspecified    No past surgical history on file. Social History   Socioeconomic History  . Marital status: Single    Spouse name: Not on file  . Number of children: Not on file  . Years of education: Not on file  . Highest education level: Not on file  Occupational History  . Occupation: Ship broker  Social Needs  . Financial resource strain: Not on file  . Food insecurity    Worry: Not on file    Inability: Not on file  . Transportation needs    Medical: Not on file    Non-medical: Not on file  Tobacco Use  . Smoking status: Never Smoker  . Smokeless tobacco: Never Used  Substance and Sexual Activity  . Alcohol use: No  . Drug use: No  . Sexual activity: Not on file  Lifestyle  . Physical activity    Days per week: Not on file    Minutes per session: Not on file  . Stress: Not on file  Relationships  . Social Herbalist on phone: Not on file    Gets together: Not on file    Attends religious service: Not on file    Active member of club or organization: Not on file    Attends meetings of clubs or organizations: Not on file    Relationship status: Not on  file  Other Topics Concern  . Not on file  Social History Narrative  . Not on file   Allergies  Allergen Reactions  . Doxycycline Nausea Only   Family History  Problem Relation Age of Onset  . Arthritis Other   . Hypertension Other   . Lupus Mother   . Cancer Maternal Grandfather   . Diabetes Maternal Grandfather          Current Outpatient Medications (Other):  [name]  Diclofenac Sodium 2 % SOLN, Place 2 g onto the skin 2 (two) times daily. .  hydrocortisone (ANUSOL-HC) 25 MG suppository, Place 1 suppository (25 mg total) rectally every 12 (twelve) hours.    Past medical history, social, surgical and family history all reviewed in electronic medical record.  No pertanent information unless stated regarding to the chief complaint.   Review of Systems:  No headache, visual changes, nausea, vomiting, diarrhea, constipation, dizziness, abdominal pain, skin rash, fevers, chills, night sweats, weight loss, swollen lymph nodes, body aches, joint swelling, muscle aches, chest pain, shortness of breath, mood changes.   Objective  Blood pressure 128/74, pulse 74, height 5\' 10"  (1.778 m), weight 189 lb (85.7 kg), SpO2 98 %.    General: No  apparent distress alert and oriented x3 mood and affect normal, dressed appropriately.  HEENT: Pupils equal, extraocular movements intact  Respiratory: Patient's speak in full sentences and does not appear short of breath  Cardiovascular: No lower extremity edema, non tender, no erythema  Skin: Warm dry intact with no signs of infection or rash on extremities or on axial skeleton.  Abdomen: Soft nontender  Neuro: Cranial nerves II through XII are intact, neurovascularly intact in all extremities with 2+ DTRs and 2+ pulses.  Lymph: No lymphadenopathy of posterior or anterior cervical chain or axillae bilaterally.  Gait antalgic MSK:  Non tender with full range of motion and good stability and symmetric strength and tone of shoulders, elbows, wrist,  hip, knee and ankles bilaterally.  Right foot exam shows breakdown of the transverse arch.  Mild overpronation of the hindfoot.  Patient does have some very mild tenderness over the first MTP and but improvement in range of motion.  No cystic formation noted.  Neurovascularly intact distally.  Full range of motion of the ankle Contralateral foot unremarkable    Impression and Recommendations:      The above documentation has been reviewed and is accurate and complete Xavier SaaZachary M Smith, DO       Note: This dictation was prepared with Dragon dictation along with smaller phrase technology. Any transcriptional errors that result from this process are unintentional.

## 2019-06-04 NOTE — Patient Instructions (Signed)
Good to see you Xavier Cochran call you about orthotics

## 2019-06-04 NOTE — Assessment & Plan Note (Signed)
Significant improvement after the injection.  Unable to even palpate. Patient will do well then secondary to the breakdown of the foot I am concerned that patient could have recurrent.  Patient will be fitted for custom orthotics at this time due to him having difficulty wearing certain types of shoes.  Hopefully this will be beneficial.  Follow-up with me again 3 to 4 weeks after.

## 2019-06-14 ENCOUNTER — Telehealth: Payer: Self-pay | Admitting: Family

## 2019-06-14 ENCOUNTER — Other Ambulatory Visit (INDEPENDENT_AMBULATORY_CARE_PROVIDER_SITE_OTHER): Payer: 59

## 2019-06-14 ENCOUNTER — Encounter: Payer: Self-pay | Admitting: Family

## 2019-06-14 ENCOUNTER — Other Ambulatory Visit: Payer: Self-pay | Admitting: Family

## 2019-06-14 ENCOUNTER — Ambulatory Visit
Admission: RE | Admit: 2019-06-14 | Discharge: 2019-06-14 | Disposition: A | Discharge status: Home / Self Care | Payer: 59 | Source: Ambulatory Visit | Attending: Family | Admitting: Family

## 2019-06-14 ENCOUNTER — Ambulatory Visit (INDEPENDENT_AMBULATORY_CARE_PROVIDER_SITE_OTHER): Payer: 59 | Admitting: Family

## 2019-06-14 ENCOUNTER — Other Ambulatory Visit: Payer: Self-pay

## 2019-06-14 VITALS — BP 108/76 | HR 74 | Temp 97.8°F | Ht 70.0 in | Wt 189.0 lb

## 2019-06-14 DIAGNOSIS — R109 Unspecified abdominal pain: Secondary | ICD-10-CM

## 2019-06-14 DIAGNOSIS — R3915 Urgency of urination: Secondary | ICD-10-CM

## 2019-06-14 DIAGNOSIS — R3911 Hesitancy of micturition: Secondary | ICD-10-CM

## 2019-06-14 LAB — URINALYSIS
Bilirubin Urine: NEGATIVE
Hgb urine dipstick: NEGATIVE
Ketones, ur: NEGATIVE
Leukocytes,Ua: NEGATIVE
Nitrite: NEGATIVE
Specific Gravity, Urine: <=1.005 — AB (ref 1.000–1.030)
Total Protein, Urine: NEGATIVE
Urine Glucose: NEGATIVE
Urobilinogen, UA: 0.2 (ref 0.0–1.0)
pH: 6.5 (ref 5.0–8.0)

## 2019-06-14 LAB — POC URINALSYSI DIPSTICK (AUTOMATED)
Bilirubin, UA: NEGATIVE
Blood, UA: NEGATIVE
Glucose, UA: NEGATIVE
Ketones, UA: NEGATIVE
Leukocytes, UA: NEGATIVE
Nitrite, UA: NEGATIVE
Protein, UA: NEGATIVE
Spec Grav, UA: 1.01 (ref 1.010–1.025)
Urobilinogen, UA: 0.2 E.U./dL
pH, UA: 6.5 (ref 5.0–8.0)

## 2019-06-14 LAB — COMPREHENSIVE METABOLIC PANEL
ALT: 18 U/L (ref 0–53)
AST: 14 U/L (ref 0–37)
Albumin: 4.8 g/dL (ref 3.5–5.2)
Alkaline Phosphatase: 49 U/L (ref 39–117)
BUN: 11 mg/dL (ref 6–23)
CO2: 30 mEq/L (ref 19–32)
Calcium: 9.4 mg/dL (ref 8.4–10.5)
Chloride: 100 mEq/L (ref 96–112)
Creatinine, Ser: 1.09 mg/dL (ref 0.40–1.50)
GFR: 98.08 mL/min (ref >60.00)
Glucose, Bld: 102 mg/dL — ABNORMAL HIGH (ref 70–99)
Potassium: 4.2 mEq/L (ref 3.5–5.1)
Sodium: 139 mEq/L (ref 135–145)
Total Bilirubin: 0.7 mg/dL (ref 0.2–1.2)
Total Protein: 7.1 g/dL (ref 6.0–8.3)

## 2019-06-14 LAB — CBC WITH DIFFERENTIAL/PLATELET
Basophils Absolute: 0 10*3/uL (ref 0.0–0.1)
Basophils Relative: 1.4 % (ref 0.0–3.0)
Eosinophils Absolute: 0.1 10*3/uL (ref 0.0–0.7)
Eosinophils Relative: 2.4 % (ref 0.0–5.0)
HCT: 47.1 % (ref 39.0–52.0)
Hemoglobin: 15.8 g/dL (ref 13.0–17.0)
Lymphocytes Relative: 42.2 % (ref 12.0–46.0)
Lymphs Abs: 1.5 10*3/uL (ref 0.7–4.0)
MCHC: 33.6 g/dL (ref 30.0–36.0)
MCV: 83.1 fl (ref 78.0–100.0)
Monocytes Absolute: 0.4 10*3/uL (ref 0.1–1.0)
Monocytes Relative: 11.2 % (ref 3.0–12.0)
Neutro Abs: 1.5 10*3/uL (ref 1.4–7.7)
Neutrophils Relative %: 42.8 % — ABNORMAL LOW (ref 43.0–77.0)
Platelets: 246 10*3/uL (ref 150.0–400.0)
RBC: 5.66 Mil/uL (ref 4.22–5.81)
RDW: 14.2 % (ref 11.5–15.5)
WBC: 3.6 10*3/uL — ABNORMAL LOW (ref 4.0–10.5)

## 2019-06-14 LAB — PSA: PSA: 0.33 ng/mL (ref 0.10–4.00)

## 2019-06-14 MED ORDER — TAMSULOSIN HCL 0.4 MG PO CAPS: 0.4000 mg | ORAL_CAPSULE | Freq: Every day | ORAL | 1 refills | Status: DC

## 2019-06-14 NOTE — Progress Notes (Signed)
Xavier Cochran. is a 28 y.o. male with the following history as recorded in [facility]:  Patient Active Problem List   Diagnosis Date Noted  . Acute prostatitis 03/06/2019  . Hemorrhoid 03/06/2019  . Acute shoulder bursitis, right 02/27/2019  . Ganglion cyst of foot 02/27/2019  . Asthma 04/28/2018  . Constipation 07/08/2016  . Right knee pain 05/29/2015  . DOE (dyspnea on exertion) 06/10/2013  . Venereal disease, unspecified 11/02/2012  . Allergic rhinitis   . External hemorrhoid, thrombosed 10/24/2012  . Osgood-Schlatter's disease 10/28/2011  . Preventative health care 10/22/2011    Current Outpatient Medications  Medication Sig Dispense Refill  . Diclofenac Sodium 2 % SOLN Place 2 g onto the skin 2 (two) times daily. 112 g 3  . hydrocortisone (ANUSOL-HC) 25 MG suppository Place 1 suppository (25 mg total) rectally every 12 (twelve) hours. (Patient not taking: Reported on 06/14/2019) 12 suppository 1   No current facility-administered medications for this visit.     Allergies: Doxycycline  Past Medical History:  Diagnosis Date  . Allergic rhinitis, cause unspecified     History reviewed. No pertinent surgical history.  Family History  Problem Relation Age of Onset  . Arthritis Other   . Hypertension Other   . Lupus Mother   . Cancer Maternal Grandfather   . Diabetes Maternal Grandfather     Social History   Tobacco Use  . Smoking status: Never Smoker  . Smokeless tobacco: Never Used  Substance Use Topics  . Alcohol use: No    Subjective:  Patient presents with concerns for decreased urinary discharge/ bladder pressure x 2 weeks; localized pubic pain but denies any back pain; no blood in urine; feels like there is a hesitancy to urinate; was treated for prostate infection in early April with Cipro and felt like symptoms resolved; denies any penile discharge or concerns for STD exposure; no burning with urination; + sense of incomplete emptying;     Objective:  Vitals:    06/14/19 0843  BP: 108/76  Pulse: 74  Temp: 97.8 F (36.6 C)  TempSrc: Oral  SpO2: 97%  Weight: 189 lb (85.7 kg)  Height: 5' 10"  (1.778 m)    General: Well developed, well nourished, in no acute distress  Skin : Warm and dry.  Head: Normocephalic and atraumatic  Lungs: Respirations unlabored; clear to auscultation bilaterally without wheeze, rales, rhonchi  Abdomen: Soft; nontender; nondistended; normoactive bowel sounds; no masses or hepatosplenomegaly  Musculoskeletal: No deformities; no active joint inflammation  Extremities: No edema, cyanosis, clubbing  Vessels: Symmetric bilaterally  Neurologic: Alert and oriented; speech intact; face symmetrical; moves all extremities well; CNII-XII intact without focal deficit   Assessment:  1. Urinary urgency   2. Abdominal pain, unspecified abdominal location     Plan:  ? Etiology; update labs today including CBC, CMP, U/A and urine culture, G/C urine, PSA; check renal stone CT as well; patient has had numerous urologic complaints in the past year- if no source of symptoms noted on CT, will need to refer to urology.   No follow-ups on file.  Orders Placed This Encounter  Procedures  . Urine Culture    Standing Status:   Future    Number of Occurrences:   1    Standing Expiration Date:   06/13/2020  . GC/Chlamydia Probe Amp([facility])    Standing Status:   Future    Number of Occurrences:   1    Standing Expiration Date:   06/13/2020  . CT RENAL  STONE STUDY    Stone protocol 189 / no needs / Ecologist order/ miriam w lori   06/14/2019 no to COVID-19 questions ; patient aware to come alone & to wear mask/ miriam 9:10am    Standing Status:   Future    Number of Occurrences:   1    Standing Expiration Date:   09/13/2020    Order Specific Question:   Preferred imaging location?    Answer:   [facility]    Order Specific Question:   Radiology Contrast Protocol - do NOT remove file path    Answer:    [email or web address]  . Urinalysis    Standing Status:   Future    Number of Occurrences:   1    Standing Expiration Date:   06/13/2020  . PSA    Standing Status:   Future    Number of Occurrences:   1    Standing Expiration Date:   06/13/2020  . CBC w/Diff    Standing Status:   Future    Number of Occurrences:   1    Standing Expiration Date:   06/13/2020  . Comp Met (CMET)    Standing Status:   Future    Number of Occurrences:   1    Standing Expiration Date:   06/13/2020  . POCT Urinalysis Dipstick (Automated)    Requested Prescriptions    No prescriptions requested or ordered in this encounter

## 2019-06-14 NOTE — Telephone Encounter (Signed)
Spoke with patient and info given 

## 2019-06-14 NOTE — Telephone Encounter (Signed)
Please let him know I called in a medication called Flomax to help with the urinary symptoms; try taking once a day; will be in touch next week when remainder of labs are back; CT was essentially normal.

## 2019-06-14 NOTE — Addendum Note (Signed)
Addended by: Sherlene Shams on: 06/14/2019 12:01 PM   Modules accepted: Orders

## 2019-06-16 LAB — URINE CULTURE

## 2019-06-17 ENCOUNTER — Encounter: Payer: Self-pay | Admitting: Family Medicine

## 2019-06-17 ENCOUNTER — Other Ambulatory Visit: Payer: Self-pay

## 2019-06-17 ENCOUNTER — Ambulatory Visit (INDEPENDENT_AMBULATORY_CARE_PROVIDER_SITE_OTHER): Payer: 59 | Admitting: Family Medicine

## 2019-06-17 DIAGNOSIS — M67479 Ganglion, unspecified ankle and foot: Secondary | ICD-10-CM

## 2019-06-17 NOTE — Progress Notes (Signed)
Procedure Note   Patient was fitted for a : standard, cushioned, semi-rigid orthotic. The orthotic was heated and afterward the patient patient seated position and molded The patient was positioned in subtalar neutral position and 10 degrees of ankle dorsiflexion in a weight bearing stance. After completion of molding, patient did have orthotic management The blank was ground to a stable position for weight bearing. Size: 10 Base: Carbon fiber Additional Posting and Padding: Left medial 270/90 300/110 Posterior lateral 250/35 lateral 250/35 Right 300/110 270/90 posterior lateral 250/35 lateral 250/35 1st ray 250/35 The patient ambulated these, and they were very comfortable. [name]

## 2019-06-17 NOTE — Assessment & Plan Note (Signed)
Recurrent gangrene of the foot.  Concern with patient states that this with reaccumulated.  Custom orthotics fitted today.  Discussed icing regimen, home exercise, which activities to do which wants to avoid.  Patient is to increase activity slowly over the course the next several weeks.  Follow-up again in 4 to 8 weeks.

## 2019-06-21 LAB — GC/CHLAMYDIA PROBE AMP
Chlamydia trachomatis, NAA: NEGATIVE
Neisseria Gonorrhoeae by PCR: NEGATIVE

## 2019-06-26 ENCOUNTER — Ambulatory Visit: Payer: 59 | Admitting: Family Medicine

## 2019-08-15 ENCOUNTER — Other Ambulatory Visit: Payer: Self-pay

## 2019-08-15 ENCOUNTER — Ambulatory Visit: Payer: 59 | Admitting: Family Medicine

## 2019-08-15 ENCOUNTER — Ambulatory Visit: Payer: Self-pay

## 2019-08-15 ENCOUNTER — Encounter: Payer: Self-pay | Admitting: Family Medicine

## 2019-08-15 VITALS — BP 124/80 | HR 62 | Ht 70.0 in | Wt 188.0 lb

## 2019-08-15 DIAGNOSIS — G8929 Other chronic pain: Secondary | ICD-10-CM | POA: Diagnosis not present

## 2019-08-15 DIAGNOSIS — M25561 Pain in right knee: Secondary | ICD-10-CM | POA: Diagnosis not present

## 2019-08-15 DIAGNOSIS — S83411A Sprain of medial collateral ligament of right knee, initial encounter: Secondary | ICD-10-CM | POA: Diagnosis not present

## 2019-08-15 NOTE — Progress Notes (Signed)
X[facility] Co[facility] [place], [place] [address] Phone: [phone] Subjective:   I Xavier Cochran am serving as a Education administrator for Dr. Hulan Saas.  I'm seeing this patient by the request  of:    CC: knee pain   [id number]   06/17/2019 Recurrent gangrene of the foot.  Concern with patient states that this with reaccumulated.  Custom orthotics fitted today.  Discussed icing regimen, home exercise, which activities to do which wants to avoid.  Patient is to increase activity slowly over the course the next several weeks.  Follow-up again in 4 to 8 weeks.  08/15/2019 Xavier Cochran. is a 28 y.o. male coming in with complaint of right knee pain. States he had an accident at work. 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 Heard a pop and the knee has been sore ever since. States that without compression he feels pain. Patient states that when he elevates his knee his lower leg goes numb.   Onset- Acute last Friday  Location - Patellar tendon Duration-  Character- dull pain  Aggravating factors- walking Reliving factors-  Therapies tried- compression sleeve, elevation Severity-6 out of 10 with feels like is some mild instability.     Past Medical History:  Diagnosis Date  . Allergic rhinitis, cause unspecified    No past surgical history on file. Social History   Socioeconomic History  . Marital status: Single    Spouse name: Not on file  . Number of children: Not on file  . Years of education: Not on file  . Highest education level: Not on file  Occupational History  . Occupation: Ship broker  Social Needs  . Financial resource strain: Not on file  . Food insecurity    Worry: Not on file    Inability: Not on file  . Transportation needs    Medical: Not on file    Non-medical: Not on file  Tobacco Use  . Smoking status: Never Smoker  . Smokeless tobacco: Never Used  Substance and Sexual Activity   . Alcohol use: No  . Drug use: No  . Sexual activity: Not on file  Lifestyle  . Physical activity    Days per week: Not on file    Minutes per session: Not on file  . Stress: Not on file  Relationships  . Social Herbalist on phone: Not on file    Gets together: Not on file    Attends religious service: Not on file    Active member of club or organization: Not on file    Attends meetings of clubs or organizations: Not on file    Relationship status: Not on file  Other Topics Concern  . Not on file  Social History Narrative  . Not on file   Allergies  Allergen Reactions  . Doxycycline Nausea Only   Family History  Problem Relation Age of Onset  . Arthritis Other   . Hypertension Other   . Lupus Mother   . Cancer Maternal Grandfather   . Diabetes Maternal Grandfather          Current Outpatient Medications (Other):  [name]  Diclofenac Sodium 2 % SOLN, Place 2 g onto the skin 2 (two) times daily. .  hydrocortisone (ANUSOL-HC) 25 MG suppository, Place 1 suppository (25 mg total) rectally every 12 (twelve) hours. (Patient not taking: Reported on 06/14/2019) .  tamsulosin (FLOMAX) 0.4  MG CAPS capsule, Take 1 capsule (0.4 mg total) by mouth daily.    Past medical history, social, surgical and family history all reviewed in electronic medical record.  No pertanent information unless stated regarding to the chief complaint.   Review of Systems:  No headache, visual changes, nausea, vomiting, diarrhea, constipation, dizziness, abdominal pain, skin rash, fevers, chills, night sweats, weight loss, swollen lymph nodes, body aches, joint swelling, muscle aches, chest pain, shortness of breath, mood changes.   Objective  There were no vitals taken for this visit. Systems examined below as of    General: No apparent distress alert and oriented x3 mood and affect normal, dressed appropriately.  HEENT: Pupils equal, extraocular movements intact  Respiratory: Patient's  speak in full sentences and does not appear short of breath  Cardiovascular: No lower extremity edema, non tender, no erythema  Skin: Warm dry intact with no signs of infection or rash on extremities or on axial skeleton.  Abdomen: Soft nontender  Neuro: Cranial nerves II through XII are intact, neurovascularly intact in all extremities with 2+ DTRs and 2+ pulses.  Lymph: No lymphadenopathy of posterior or anterior cervical chain or axillae bilaterally.  Gait normal with good balance and coordination.  MSK:  Non tender with full range of motion and good stability and symmetric strength and tone of shoulders, elbows, wrist, hip and ankles bilaterally.  Right knee exam shows the patient does have a trace effusion.  Some pain with full extension of the knee. Is tender over the medial tibial plateau somewhat.  Positive McMurray's.  Mild pain over the patella tendon insertion in the tuberosity.  Limited musculoskeletal ultrasound was performed and interpreted by Judi SaaZachary M   Limited ultrasound of patient's MCL shows that there is hypoechoic changes.  No gapping noted noted on dynamic testing.  MCL does have a mostly cystic change or potential mucoid meniscus anteriorly but patient is tender in this area.  Trace effusion of the patellofemoral joint noted.    Impression and Recommendations:     This case required medical decision making of moderate complexity. The above documentation has been reviewed and is accurate and complete Judi SaaZachary M , DO       Note: This dictation was prepared with Dragon dictation along with smaller phrase technology. Any transcriptional errors that result from this process are unintentional.

## 2019-08-15 NOTE — Assessment & Plan Note (Addendum)
Sprain noted.  Discussed with patient, topical regimen, mild limitations at work.  Discussed the possibility of which activities to do which wants to avoid.  Patient does have some very mild instability noted of the knee and a trace effusion which can be consistent with also a meniscal tear and we will monitor.  May need advanced imaging depending.  Patient will follow-up again in 3 to 4 weeks

## 2019-08-15 NOTE — Patient Instructions (Addendum)
Good to see you Exercise 3 times a week See [place] again in 3 weeks

## 2019-08-19 ENCOUNTER — Other Ambulatory Visit: Payer: Self-pay

## 2019-08-19 ENCOUNTER — Telehealth: Payer: Self-pay

## 2019-08-19 ENCOUNTER — Ambulatory Visit (INDEPENDENT_AMBULATORY_CARE_PROVIDER_SITE_OTHER)
Admission: RE | Admit: 2019-08-19 | Discharge: 2019-08-19 | Disposition: A | Discharge status: Home / Self Care | Payer: 59 | Source: Ambulatory Visit | Attending: Family Medicine | Admitting: Family Medicine

## 2019-08-19 DIAGNOSIS — M25561 Pain in right knee: Secondary | ICD-10-CM

## 2019-08-19 NOTE — Telephone Encounter (Signed)
Spoke with patient who would like an MRI and pain medication. Per a verbal from Dr. Tamala Julian, ok to order MRI and patient should alternate IBU and Tylenol throughout the day for pain. Left voicemail for patient to call back to get information.

## 2019-08-19 NOTE — Telephone Encounter (Signed)
Spoke with patient.

## 2019-09-04 ENCOUNTER — Ambulatory Visit: Payer: 59 | Admitting: Family Medicine

## 2019-09-04 NOTE — Progress Notes (Deleted)
[facility] [place], [place] [address] Phone: [phone] Subjective:    I'm seeing this patient by the request  of:    CC:   [id number]    08/15/19: Sprain noted.  Discussed with patient, topical regimen, mild limitations at work.  Discussed the possibility of which activities to do which wants to avoid.  Patient does have some very mild instability noted of the knee and a trace effusion which can be consistent with also a meniscal tear and we will monitor.  May need advanced imaging depending.  Patient will follow-up again in 3 to 4 weeks  Update- 09/04/19: Xavier Cochran. is a 28 y.o. male coming in with complaint of   Onset-  Location Duration-  Character- Aggravating factors- Reliving factors-  Therapies tried-  Severity-     Past Medical History:  Diagnosis Date  . Allergic rhinitis, cause unspecified    No past surgical history on file. Social History   Socioeconomic History  . Marital status: Single    Spouse name: Not on file  . Number of children: Not on file  . Years of education: Not on file  . Highest education level: Not on file  Occupational History  . Occupation: Ship broker  Social Needs  . Financial resource strain: Not on file  . Food insecurity    Worry: Not on file    Inability: Not on file  . Transportation needs    Medical: Not on file    Non-medical: Not on file  Tobacco Use  . Smoking status: Never Smoker  . Smokeless tobacco: Never Used  Substance and Sexual Activity  . Alcohol use: No  . Drug use: No  . Sexual activity: Not on file  Lifestyle  . Physical activity    Days per week: Not on file    Minutes per session: Not on file  . Stress: Not on file  Relationships  . Social Herbalist on phone: Not on file    Gets together: Not on file    Attends religious service: Not on file    Active member of club or organization: Not on file    Attends meetings of clubs or  organizations: Not on file    Relationship status: Not on file  Other Topics Concern  . Not on file  Social History Narrative  . Not on file   Allergies  Allergen Reactions  . Doxycycline Nausea Only   Family History  Problem Relation Age of Onset  . Arthritis Other   . Hypertension Other   . Lupus Mother   . Cancer Maternal Grandfather   . Diabetes Maternal Grandfather          Current Outpatient Medications (Other):  [name]  Diclofenac Sodium 2 % SOLN, Place 2 g onto the skin 2 (two) times daily. .  hydrocortisone (ANUSOL-HC) 25 MG suppository, Place 1 suppository (25 mg total) rectally every 12 (twelve) hours. .  tamsulosin (FLOMAX) 0.4 MG CAPS capsule, Take 1 capsule (0.4 mg total) by mouth daily.    Past medical history, social, surgical and family history all reviewed in electronic medical record.  No pertanent information unless stated regarding to the chief complaint.   Review of Systems:  No headache, visual changes, nausea, vomiting, diarrhea, constipation, dizziness, abdominal pain, skin rash, fevers, chills, night sweats, weight loss, swollen lymph nodes, body aches, joint swelling, muscle aches, chest pain, shortness of breath, mood changes.   Objective  There were no vitals taken for this visit. Systems examined below as of    General: No apparent distress alert and oriented x3 mood and affect normal, dressed appropriately.  HEENT: Pupils equal, extraocular movements intact  Respiratory: Patient's speak in full sentences and does not appear short of breath  Cardiovascular: No lower extremity edema, non tender, no erythema  Skin: Warm dry intact with no signs of infection or rash on extremities or on axial skeleton.  Abdomen: Soft nontender  Neuro: Cranial nerves II through XII are intact, neurovascularly intact in all extremities with 2+ DTRs and 2+ pulses.  Lymph: No lymphadenopathy of posterior or anterior cervical chain or axillae bilaterally.  Gait normal  with good balance and coordination.  MSK:  Non tender with full range of motion and good stability and symmetric strength and tone of shoulders, elbows, wrist, hip, knee and ankles bilaterally.     Impression and Recommendations:     This case required medical decision making of moderate complexity. The above documentation has been reviewed and is accurate and complete Judi Saa, DO       Note: This dictation was prepared with Dragon dictation along with smaller phrase technology. Any transcriptional errors that result from this process are unintentional.

## 2019-09-09 ENCOUNTER — Encounter: Payer: Self-pay | Admitting: Family

## 2020-07-24 DIAGNOSIS — M2351 Chronic instability of knee, right knee: Secondary | ICD-10-CM | POA: Insufficient documentation

## 2020-07-29 ENCOUNTER — Other Ambulatory Visit: Payer: Self-pay | Admitting: Orthopedic Surgery

## 2020-07-29 DIAGNOSIS — M25561 Pain in right knee: Secondary | ICD-10-CM

## 2020-07-29 DIAGNOSIS — M2351 Chronic instability of knee, right knee: Secondary | ICD-10-CM

## 2020-07-31 ENCOUNTER — Ambulatory Visit
Admission: RE | Admit: 2020-07-31 | Discharge: 2020-07-31 | Disposition: A | Discharge status: Home / Self Care | Payer: 59 | Source: Ambulatory Visit | Attending: Orthopedic Surgery | Admitting: Orthopedic Surgery

## 2020-07-31 ENCOUNTER — Other Ambulatory Visit: Payer: Self-pay

## 2020-07-31 DIAGNOSIS — M2351 Chronic instability of knee, right knee: Secondary | ICD-10-CM

## 2020-07-31 DIAGNOSIS — M25561 Pain in right knee: Secondary | ICD-10-CM

## 2020-10-30 ENCOUNTER — Ambulatory Visit: Payer: 59 | Admitting: Internal Medicine

## 2020-10-30 ENCOUNTER — Other Ambulatory Visit (INDEPENDENT_AMBULATORY_CARE_PROVIDER_SITE_OTHER): Payer: 59

## 2020-10-30 ENCOUNTER — Encounter: Payer: Self-pay | Admitting: Internal Medicine

## 2020-10-30 ENCOUNTER — Other Ambulatory Visit: Payer: Self-pay

## 2020-10-30 VITALS — BP 140/90 | HR 80 | Temp 98.5°F | Ht 70.0 in | Wt 194.0 lb

## 2020-10-30 DIAGNOSIS — R202 Paresthesia of skin: Secondary | ICD-10-CM | POA: Diagnosis not present

## 2020-10-30 DIAGNOSIS — Z1159 Encounter for screening for other viral diseases: Secondary | ICD-10-CM

## 2020-10-30 DIAGNOSIS — J4531 Mild persistent asthma with (acute) exacerbation: Secondary | ICD-10-CM | POA: Diagnosis not present

## 2020-10-30 DIAGNOSIS — J309 Allergic rhinitis, unspecified: Secondary | ICD-10-CM | POA: Diagnosis not present

## 2020-10-30 DIAGNOSIS — Z0001 Encounter for general adult medical examination with abnormal findings: Secondary | ICD-10-CM | POA: Diagnosis not present

## 2020-10-30 DIAGNOSIS — K5900 Constipation, unspecified: Secondary | ICD-10-CM

## 2020-10-30 DIAGNOSIS — K649 Unspecified hemorrhoids: Secondary | ICD-10-CM

## 2020-10-30 LAB — CBC WITH DIFFERENTIAL/PLATELET
Basophils Absolute: 0 10*3/uL (ref 0.0–0.1)
Basophils Relative: 1.3 % (ref 0.0–3.0)
Eosinophils Absolute: 0.1 10*3/uL (ref 0.0–0.7)
Eosinophils Relative: 2.4 % (ref 0.0–5.0)
HCT: 45.3 % (ref 39.0–52.0)
Hemoglobin: 15.1 g/dL (ref 13.0–17.0)
Lymphocytes Relative: 40 % (ref 12.0–46.0)
Lymphs Abs: 1.2 10*3/uL (ref 0.7–4.0)
MCHC: 33.4 g/dL (ref 30.0–36.0)
MCV: 81.1 fl (ref 78.0–100.0)
Monocytes Absolute: 0.4 10*3/uL (ref 0.1–1.0)
Monocytes Relative: 12.4 % — ABNORMAL HIGH (ref 3.0–12.0)
Neutro Abs: 1.3 10*3/uL — ABNORMAL LOW (ref 1.4–7.7)
Neutrophils Relative %: 43.9 % (ref 43.0–77.0)
Platelets: 251 10*3/uL (ref 150.0–400.0)
RBC: 5.59 Mil/uL (ref 4.22–5.81)
RDW: 13.9 % (ref 11.5–15.5)
WBC: 2.9 10*3/uL — ABNORMAL LOW (ref 4.0–10.5)

## 2020-10-30 LAB — TSH: TSH: 1.97 u[IU]/mL (ref 0.35–4.50)

## 2020-10-30 LAB — LIPID PANEL
Cholesterol: 118 mg/dL (ref 0–200)
HDL: 46.5 mg/dL (ref >39.00)
LDL Cholesterol: 61 mg/dL (ref 0–99)
NonHDL: 71.4
Total CHOL/HDL Ratio: 3
Triglycerides: 52 mg/dL (ref 0.0–149.0)
VLDL: 10.4 mg/dL (ref 0.0–40.0)

## 2020-10-30 LAB — URINALYSIS, ROUTINE W REFLEX MICROSCOPIC
Bilirubin Urine: NEGATIVE
Ketones, ur: NEGATIVE
Leukocytes,Ua: NEGATIVE
Nitrite: NEGATIVE
Specific Gravity, Urine: 1.025 (ref 1.000–1.030)
Total Protein, Urine: NEGATIVE
Urine Glucose: NEGATIVE
Urobilinogen, UA: 0.2 (ref 0.0–1.0)
WBC, UA: NONE SEEN (ref 0–?)
pH: 6 (ref 5.0–8.0)

## 2020-10-30 LAB — BASIC METABOLIC PANEL
BUN: 14 mg/dL (ref 6–23)
CO2: 29 mEq/L (ref 19–32)
Calcium: 9.8 mg/dL (ref 8.4–10.5)
Chloride: 102 mEq/L (ref 96–112)
Creatinine, Ser: 1.15 mg/dL (ref 0.40–1.50)
GFR: 86.59 mL/min (ref >60.00)
Glucose, Bld: 100 mg/dL — ABNORMAL HIGH (ref 70–99)
Potassium: 3.8 mEq/L (ref 3.5–5.1)
Sodium: 139 mEq/L (ref 135–145)

## 2020-10-30 LAB — HEPATIC FUNCTION PANEL
ALT: 24 U/L (ref 0–53)
AST: 16 U/L (ref 0–37)
Albumin: 4.7 g/dL (ref 3.5–5.2)
Alkaline Phosphatase: 55 U/L (ref 39–117)
Bilirubin, Direct: 0.1 mg/dL (ref 0.0–0.3)
Total Bilirubin: 0.6 mg/dL (ref 0.2–1.2)
Total Protein: 7.5 g/dL (ref 6.0–8.3)

## 2020-10-30 LAB — VITAMIN B12: Vitamin B-12: 174 pg/mL — ABNORMAL LOW (ref 211–911)

## 2020-10-30 MED ORDER — METHYLPREDNISOLONE ACETATE 80 MG/ML IJ SUSP
80.0000 mg | Freq: Once | INTRAMUSCULAR | Status: AC
  Administered 2020-10-30: 80 mg via INTRAMUSCULAR

## 2020-10-30 NOTE — Progress Notes (Signed)
Subjective:    Patient ID: Xavier Cochran., male    DOB: 07-20-1991, 29 y.o.   MRN: [id number]  Hpi;  Here for wellness and f/u;  Overall doing ok;  Pt denies Chest pain, worsening SOB, DOE, wheezing, orthopnea, PND, worsening LE edema, palpitations, dizziness or syncope.  Pt denies neurological change such as new headache, facial or extremity weakness, but has had mild intermittent tingling to the left arm without neck or other pain, nothing seems to make better or worse.  Pt denies polydipsia, polyuria, or low sugar symptoms. Pt states overall good compliance with treatment and medications, good tolerability, and has been trying to follow appropriate diet.  Pt denies worsening depressive symptoms, suicidal ideation or panic. No fever, night sweats, wt loss, loss of appetite, or other constitutional symptoms.  Pt states good ability with ADL's, has low fall risk, home safety reviewed and adequate, no other significant changes in hearing or vision, and only occasionally active with exercise. Also Does have several wks ongoing nasal allergy symptoms with clearish congestion, itch and sneezing, without fever, pain, ST, cough, swelling or wheezing. Also c/o worsening constipation for several wks with intermittent nausea.  Also has ongoing hemorrhoid what sounds like grade 3 with intermitent prolapse, asks for referral, no bleeding Past Medical History:  Diagnosis Date  . Allergic rhinitis, cause unspecified    History reviewed. No pertinent surgical history.  reports that he has never smoked. He has never used smokeless tobacco. He reports that he does not drink alcohol and does not use drugs. family history includes Arthritis in an other family member; Cancer in his maternal grandfather; Diabetes in his maternal grandfather; Hypertension in an other family member; Lupus in his mother. Allergies  Allergen Reactions  . Doxycycline Nausea Only   Current Outpatient Medications on File Prior to Visit    Medication Sig Dispense Refill  . COVID-19 Specimen Collection KIT See admin instructions. for testing    . Diclofenac Sodium 2 % SOLN Place 2 g onto the skin 2 (two) times daily. 112 g 3  . tamsulosin (FLOMAX) 0.4 MG CAPS capsule Take 1 capsule (0.4 mg total) by mouth daily. 30 capsule 1   No current facility-administered medications on file prior to visit.   Review of Systems All otherwise neg per pt    Objective:   Physical Exam BP 140/90 (BP Location: Left Arm, Patient Position: Sitting, Cuff Size: Large)   Pulse 80   Temp 98.5 F (36.9 C) (Oral)   Ht _0  (1.778 m)   Wt 194 lb (88 kg)   SpO2 98%   BMI 27.84 kg/m  VS noted,  Constitutional: Pt appears in NAD HENT: Head: NCAT.  Right Ear: External ear normal.  Left Ear: External ear normal.  Eyes: . Pupils are equal, round, and reactive to light. Conjunctivae and EOM are normal Nose: without d/c or deformity Neck: Neck supple. Gross normal ROM Cardiovascular: Normal rate and regular rhythm.   Pulmonary/Chest: Effort normal and breath sounds without rales or wheezing.  Abd:  Soft, NT, ND, + BS, no organomegaly Neurological: Pt is alert. At baseline orientation, motor grossly intact Skin: Skin is warm. No rashes, other new lesions, no LE edema Psychiatric: Pt behavior is normal without agitation  All otherwise neg per pt  Lab Results  Component Value Date   WBC 2.9 (L) 10/30/2020   HGB 15.1 10/30/2020   HCT 45.3 10/30/2020   PLT 251.0 10/30/2020   GLUCOSE 100 (H) 10/30/2020  CHOL 118 10/30/2020   TRIG 52.0 10/30/2020   HDL 46.50 10/30/2020   LDLCALC 61 10/30/2020   ALT 24 10/30/2020   AST 16 10/30/2020   NA 139 10/30/2020   K 3.8 10/30/2020   CL 102 10/30/2020   CREATININE 1.15 10/30/2020   BUN 14 10/30/2020   CO2 29 10/30/2020   TSH 1.97 10/30/2020   PSA 0.33 06/14/2019      Assessment & Plan:

## 2020-10-30 NOTE — Patient Instructions (Signed)
You will be contacted regarding the referral for: Gastroenterology  You had the steroid shot today  Please also consider taking OTC allegra and nasacort for allergies  Please continue all other medications as before, and refills have been done if requested.  Please have the pharmacy call with any other refills you may need.  Please continue your efforts at being more active, low cholesterol diet, and weight control.  You are otherwise up to date with prevention measures today.  Please keep your appointments with your specialists as you may have planned  Please go to the LAB at the blood drawing area for the tests to be done  You will be contacted by phone if any changes need to be made immediately.  Otherwise, you will receive a letter about your results with an explanation, but please check with MyChart first.  Please remember to sign up for MyChart if you have not done so, as this will be important to you in the future with finding out test results, communicating by private email, and scheduling acute appointments online when needed.  Please make an Appointment to return for your 1 year visit, or sooner if needed

## 2020-10-31 ENCOUNTER — Encounter: Payer: Self-pay | Admitting: Internal Medicine

## 2020-10-31 ENCOUNTER — Other Ambulatory Visit: Payer: Self-pay | Admitting: Internal Medicine

## 2020-10-31 DIAGNOSIS — E538 Deficiency of other specified B group vitamins: Secondary | ICD-10-CM | POA: Insufficient documentation

## 2020-10-31 MED ORDER — VITAMIN B-12 1000 MCG PO TABS: 1000.0000 ug | ORAL_TABLET | Freq: Every day | ORAL | 3 refills | Status: DC

## 2020-10-31 NOTE — Assessment & Plan Note (Signed)
Ok for restart metamucil,  to f/u any worsening symptoms or concerns

## 2020-10-31 NOTE — Assessment & Plan Note (Signed)
Also for referral gi

## 2020-10-31 NOTE — Assessment & Plan Note (Signed)
Also for b12 

## 2020-10-31 NOTE — Assessment & Plan Note (Signed)

## 2020-10-31 NOTE — Assessment & Plan Note (Addendum)
Ok for depomedrol 80 im, add allegra and nasacort asd,  to f/u any worsening symptoms or concerns  I spent 41 minutes in addition to time for CPX wellness examination in preparing to see the patient by review of recent labs, imaging and procedures, obtaining and reviewing separately obtained history, communicating with the patient and family or caregiver, ordering medications, tests or procedures, and documenting clinical information in the EHR including the differential Dx, treatment, and any further evaluation and other management of allergies, asthma, hemorrhoid, constipation, left arm tingling

## 2020-10-31 NOTE — Assessment & Plan Note (Signed)
stable overall by history and exam, recent data reviewed with pt, and pt to continue medical treatment as before,  to f/u any worsening symptoms or concerns  

## 2020-11-02 ENCOUNTER — Other Ambulatory Visit: Payer: Self-pay

## 2020-11-02 LAB — HEPATITIS C ANTIBODY
Hepatitis C Ab: NONREACTIVE
SIGNAL TO CUT-OFF: 0.01 (ref <1.00)

## 2020-11-23 MED ORDER — ONDANSETRON HCL 4 MG PO TABS: 4.0000 mg | ORAL_TABLET | Freq: Three times a day (TID) | ORAL | 0 refills | Status: DC | PRN

## 2020-11-26 ENCOUNTER — Telehealth: Payer: Self-pay | Admitting: Internal Medicine

## 2020-11-26 NOTE — Telephone Encounter (Signed)
Patient called and said that he tested positive for Covid 19 on 12./28/21 and was wondering if there was any medications he needed to take. Please advise.

## 2020-11-30 NOTE — Telephone Encounter (Signed)
See below

## 2020-11-30 NOTE — Telephone Encounter (Signed)
Unfortunately there is nothing specific, but to make sure to take OTC Vit C, D and zinc if he is able  Also we need to ask if he is UNvaccinated and symptoms less than 7 days - if so , we should refer to the [facility] monoclonal Ab hotline, just let me know either way, thanks

## 2021-02-08 ENCOUNTER — Encounter: Payer: Self-pay | Admitting: Internal Medicine

## 2021-02-24 ENCOUNTER — Other Ambulatory Visit: Payer: Self-pay

## 2021-02-24 ENCOUNTER — Ambulatory Visit: Payer: 59 | Admitting: Internal Medicine

## 2021-02-24 ENCOUNTER — Encounter: Payer: Self-pay | Admitting: Internal Medicine

## 2021-02-24 VITALS — BP 136/72 | HR 87 | Temp 98.6°F | Ht 70.0 in | Wt 196.0 lb

## 2021-02-24 DIAGNOSIS — J309 Allergic rhinitis, unspecified: Secondary | ICD-10-CM | POA: Diagnosis not present

## 2021-02-24 DIAGNOSIS — J4530 Mild persistent asthma, uncomplicated: Secondary | ICD-10-CM

## 2021-02-24 DIAGNOSIS — J069 Acute upper respiratory infection, unspecified: Secondary | ICD-10-CM

## 2021-02-24 MED ORDER — METHYLPREDNISOLONE ACETATE 80 MG/ML IJ SUSP
80.0000 mg | Freq: Once | INTRAMUSCULAR | Status: AC
  Administered 2021-02-24: 80 mg via INTRAMUSCULAR

## 2021-02-24 MED ORDER — AZITHROMYCIN 250 MG PO TABS: ORAL_TABLET | ORAL | 1 refills | Status: DC

## 2021-02-24 MED ORDER — PREDNISONE 10 MG PO TABS: ORAL_TABLET | ORAL | 0 refills | Status: DC

## 2021-02-24 NOTE — Progress Notes (Signed)
Patient ID: Theodor Mustin., male   DOB: 1991/09/28, 30 y.o.   MRN: [id number]        Chief Complaint: uri symptoms, allergies       HPI:  Naveed Humphres. is a 30 y.o. male here with 2-3 days acute onset fever, facial pain, pressure, headache, general weakness and malaise, and greenish d/c, with mild ST and cough, but pt denies chest pain, wheezing, increased sob or doe, orthopnea, PND, increased LE swelling, palpitations, dizziness or syncope. Also, Does have several wks ongoing nasal allergy symptoms with clearish congestion, itch and sneezing, without fever, pain, ST, cough, swelling or wheezing.  Pt tested covid neg with home testing yesterday.  Has hx of covid infection jan 2022.   Wt Readings from Last 3 Encounters:  02/24/21 196 lb (88.9 kg)  10/30/20 194 lb (88 kg)  08/15/19 188 lb (85.3 kg)   BP Readings from Last 3 Encounters:  02/24/21 136/72  10/30/20 140/90  08/15/19 124/80         Past Medical History:  Diagnosis Date  . Allergic rhinitis, cause unspecified    History reviewed. No pertinent surgical history.  reports that he has never smoked. He has never used smokeless tobacco. He reports that he does not drink alcohol and does not use drugs. family history includes Arthritis in an other family member; Cancer in his maternal grandfather; Diabetes in his maternal grandfather; Hypertension in an other family member; Lupus in his mother. Allergies  Allergen Reactions  . Doxycycline Nausea Only   Current Outpatient Medications on File Prior to Visit  Medication Sig Dispense Refill  . COVID-19 Specimen Collection KIT See admin instructions. for testing    . vitamin B-12 (CYANOCOBALAMIN) 1000 MCG tablet Take 1 tablet (1,000 mcg total) by mouth daily. 90 tablet 3  . Diclofenac Sodium 2 % SOLN Place 2 g onto the skin 2 (two) times daily. (Patient not taking: Reported on 02/24/2021) 112 g 3  . ondansetron (ZOFRAN) 4 MG tablet Take 1 tablet (4 mg total) by mouth every 8 (eight) hours  as needed for nausea or vomiting. (Patient not taking: Reported on 02/24/2021) 30 tablet 0  . tamsulosin (FLOMAX) 0.4 MG CAPS capsule Take 1 capsule (0.4 mg total) by mouth daily. 30 capsule 1   No current facility-administered medications on file prior to visit.        ROS:  All others reviewed and negative.  Objective        PE:  BP 136/72 (BP Location: Left Arm, Patient Position: Sitting, Cuff Size: Large)   Pulse 87   Temp 98.6 F (37 C) (Oral)   Ht 5' 10"  (1.778 m)   Wt 196 lb (88.9 kg)   SpO2 97%   BMI 28.12 kg/m                 Constitutional: Pt appears in NAD               HENT: Head: NCAT.                Right Ear: External ear normal.                 Left Ear: External ear normal. ; Bilat tm's with mild erythema.  Max sinus areas mild tender.  Pharynx with mild erythema, no exudate               Eyes: . Pupils are equal, round, and reactive to light. Conjunctivae and EOM are  normal               Nose: without d/c or deformity               Neck: Neck supple. Gross normal ROM               Cardiovascular: Normal rate and regular rhythm.                 Pulmonary/Chest: Effort normal and breath sounds without rales or wheezing.                Abd:  Soft, NT, ND, + BS, no organomegaly               Neurological: Pt is alert. At baseline orientation, motor grossly intact               Skin: Skin is warm. No rashes, no other new lesions, LE edema - none               Psychiatric: Pt behavior is normal without agitation   Micro: none  Cardiac tracings I have personally interpreted today:  none  Pertinent Radiological findings (summarize): none   Lab Results  Component Value Date   WBC 2.9 (L) 10/30/2020   HGB 15.1 10/30/2020   HCT 45.3 10/30/2020   PLT 251.0 10/30/2020   GLUCOSE 100 (H) 10/30/2020   CHOL 118 10/30/2020   TRIG 52.0 10/30/2020   HDL 46.50 10/30/2020   LDLCALC 61 10/30/2020   ALT 24 10/30/2020   AST 16 10/30/2020   NA 139 10/30/2020   K 3.8  10/30/2020   CL 102 10/30/2020   CREATININE 1.15 10/30/2020   BUN 14 10/30/2020   CO2 29 10/30/2020   TSH 1.97 10/30/2020   PSA 0.33 06/14/2019   Assessment/Plan:  Freddy Kinne. is a 30 y.o. Black or African American [2] male with  has a past medical history of Allergic rhinitis, cause unspecified.  URI (upper respiratory infection) Mild to mod, for antibx course,  to f/u any worsening symptoms or concerns  Allergic rhinitis Also with seasonal flare - for depomedrol IM 80, predpac, nasacort asd, followj  Asthma O/w stable, declines need for albuterol inhaler prn   Followup: No follow-ups on file.  Cathlean Cower, MD 02/28/2021 3:01 PM [facility]

## 2021-02-24 NOTE — Patient Instructions (Signed)
.  Please take all new medication as prescribed - the antiboitic, and prednisone  You had the steroid shot today  Please continue all other medications as before, and refills have been done if requested.  Please have the pharmacy call with any other refills you may need.  Please keep your appointments with your specialists as you may have planned

## 2021-02-28 ENCOUNTER — Encounter: Payer: Self-pay | Admitting: Internal Medicine

## 2021-02-28 NOTE — Assessment & Plan Note (Signed)
O/w stable, declines need for albuterol inhaler prn

## 2021-02-28 NOTE — Assessment & Plan Note (Signed)
Also with seasonal flare - for depomedrol IM 80, predpac, nasacort asd, followj

## 2021-02-28 NOTE — Assessment & Plan Note (Signed)
Mild to mod, for antibx course,  to f/u any worsening symptoms or concerns 

## 2021-03-01 ENCOUNTER — Ambulatory Visit
Admission: EM | Admit: 2021-03-01 | Discharge: 2021-03-01 | Disposition: A | Discharge status: Home / Self Care | Payer: 59

## 2021-03-01 ENCOUNTER — Other Ambulatory Visit: Payer: Self-pay

## 2021-03-22 NOTE — Progress Notes (Deleted)
   I, Christoper Fabian, LAT, ATC, am serving as scribe for Dr. Clementeen Graham.  Xavier Cochran. is a 30 y.o. male who presents to [facility] at [facility] today for shoulder pain.  He was last seen by Dr. Katrinka Blazing on 08/15/19 for R knee pain.  Since then, pt reports R/L shoulder pain x .  He locates his pain to .  Radiating pain: Shoulder mechanical symptoms: Aggravating factors: Treatments tried:   Pertinent review of systems: ***  Relevant historical information: ***   Exam:  There were no vitals taken for this visit. General: Well Developed, well nourished, and in no acute distress.   MSK: ***    Lab and Radiology Results No results found for this or any previous visit (from the past 72 hour(s)). No results found.     Assessment and Plan: 30 y.o. male with ***   PDMP not reviewed this encounter. No orders of the defined types were placed in this encounter.  No orders of the defined types were placed in this encounter.    Discussed warning signs or symptoms. Please see discharge instructions. Patient expresses understanding.   ***

## 2021-03-23 ENCOUNTER — Ambulatory Visit: Payer: 59 | Admitting: Family Medicine

## 2021-04-06 ENCOUNTER — Encounter: Payer: Self-pay | Admitting: Internal Medicine

## 2021-04-06 ENCOUNTER — Ambulatory Visit (INDEPENDENT_AMBULATORY_CARE_PROVIDER_SITE_OTHER): Payer: 59 | Admitting: Internal Medicine

## 2021-04-06 VITALS — BP 100/80 | HR 72 | Ht 68.5 in | Wt 191.4 lb

## 2021-04-06 DIAGNOSIS — K641 Second degree hemorrhoids: Secondary | ICD-10-CM | POA: Diagnosis not present

## 2021-04-06 DIAGNOSIS — K5900 Constipation, unspecified: Secondary | ICD-10-CM | POA: Diagnosis not present

## 2021-04-06 NOTE — Progress Notes (Signed)
   Xavier Cochran. 29 y.o. [birth year] [id number] Referred by: [name], MD  Assessment & Plan:   Encounter Diagnoses  Name Primary?  . Prolapsed internal hemorrhoids, grade 2 Yes  . Constipation, unspecified constipation type     Treat conservatively high-fiber diet. Preparation H as needed. Liberal fluid intake. I do not think Is helping him and he can stop spending the money for that. See me in 2 months.  Consider banding depending upon response to conservative treatment.  05/25/2021 is date of next appointment  I appreciate the opportunity to care for this patient. CC: [name], MD  Subjective:   Chief Complaint: Hemorrhoids with some abdominal cramping and rectal discomfort  HPI Xavier Cochran is a 30 year old African-American man here for the first time with complaints of hemorrhoid inflammation and "digestion".  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  He describes some struggle with defecation at times.  He has had some lower abdominal cramping recently and he started Metamucil which seems to have helped.  He had some lower abdominal cramping with that as well.  He describes grade 2 prolapsing hemorrhoids at times with spontaneous resolution.  Bowel movements are generally regular.  He is not describing significant bleeding.  He has never had an endoscopic work-up or seen a gastroenterologist. Allergies  Allergen Reactions  . Doxycycline Nausea Only   Current Meds  Medication Sig  . METAMUCIL FIBER PO Take 1 Dose by mouth daily.  . Multiple Vitamin (MULTIVITAMIN) tablet Take 1 tablet by mouth daily.  . [DISCONTINUED] Probiotic Product (ALIGN) 4 MG CAPS Take 1 capsule by mouth daily.   Past Medical History:  Diagnosis Date  . Allergic rhinitis, cause unspecified    Past Surgical History:  Procedure Laterality Date  . NO PAST SURGERIES     Social History   Social History Narrative   Patient is single  1 daughter on the way to be born in October 2022.  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      Never smoker occasional alcohol no drug use 1 caffeinated beverage daily   family history includes Lupus in his paternal grandmother.   Review of Systems As per HPI.  All other review of systems negative.  Objective:   Physical Exam BP 100/80 (BP Location: Left Arm, Patient Position: Sitting, Cuff Size: Normal)   Pulse 72   Ht 5' 8.5" (1.74 m) Comment: height measured without shoes  Wt 191 lb 6 oz (86.8 kg)   BMI 28.68 kg/m  NAD Lungs cta Cor NL abd soft NT no HSM/mass BS + Rectal NL anoderm, NL resting and vol tone No mass, nontender, tiny firm mobile nodule L ant  Anoscopy grade 2 internal hemorrhoids in all positions

## 2021-04-06 NOTE — Patient Instructions (Signed)
Continue your metamucil.  Stop your Align.  Use a high fiber diet, handout provided.  Normal BMI (Body Mass Index- based on height and weight) is between 19 and 25. Your BMI today is Body mass index is 28.68 kg/m. [name] Please consider follow up  regarding your BMI with your Primary Care Provider.  Due to recent changes in healthcare laws, you may see the results of your imaging and laboratory studies on MyChart before your provider has had a chance to review them.  We understand that in some cases there may be results that are confusing or concerning to you. Not all laboratory results come back in the same time frame and the provider may be waiting for multiple results in order to interpret others.  Please give [place] 48 hours in order for your provider to thoroughly review all the results before contacting the office for clarification of your results.    I appreciate the opportunity to care for you. Stan Head, MD, [facility]

## 2021-04-12 ENCOUNTER — Encounter: Payer: Self-pay | Admitting: Internal Medicine

## 2021-04-12 DIAGNOSIS — K641 Second degree hemorrhoids: Secondary | ICD-10-CM | POA: Insufficient documentation

## 2021-04-15 NOTE — Progress Notes (Signed)
I, Christoper Fabian, LAT, ATC, am serving as scribe for Dr. Clementeen Graham.  Xavier Cochran. is a 30 y.o. male who presents to [facility] at [facility] today for continued R shoulder pain. Pt has had prior shoulder dislocations in 2011 and 2014. He was last seen by Dr. Katrinka Blazing on 08/15/19 for R knee pain.  Since then, pt reports R shoulder has flared up over the couple months. Pt locates pain to the posterior and anterior aspects of GH joint.  He notes multiple episodes where he feels his shoulder almost dislocate and feels unstable.  He notes pain and dysfunction.  He has had extensive trials of physical therapy over the last few years and ongoing continued home exercise program directed by Dr. Katrinka Blazing.  This does not work well enough and he has significant dysfunction and pain and impairment in his quality of life.  He is ready to consider surgery at this time.  Radiating pain: yes Mechanical symptoms: yes Aggravating factors: side-lying, abd over 90 Treatments tried: PT, steroid injections   Pertinent review of systems: No fevers or chills  Relevant historical information:.  Asthma   Exam:  BP 116/82 (BP Location: Left Arm, Patient Position: Sitting, Cuff Size: Normal)   Pulse 75   Ht 5' 8.5" (1.74 m)   Wt 193 lb 6.4 oz (87.7 kg)   SpO2 98%   BMI 28.98 kg/m  General: Well Developed, well nourished, and in no acute distress.   MSK: Right shoulder normal-appearing Normal motion pain with abduction. Strength 4/5 abduction 5/5 external and internal rotation. Positive Hawkins and Neer's test.  Positive empty can test. Negative Yergason's and speeds test. Positive O'Brien test.  Positive clunk and relocation test.  Positive apprehension test. Pulses cap refill and sensation are intact distally.    Lab and Radiology Results  X-ray images right shoulder obtained today personally and independently interpreted No acute fractures.  No significant degenerative changes. Await  formal radiology review    Assessment and Plan: 30 y.o. male with right shoulder pain.  Persistent ongoing mechanical shoulder symptoms.  Patient has a history of 2 shoulder dislocations in the last 10 years.  More recently however he has repeated mechanical symptoms associated with multiple subluxation events.  This is occurred despite excellent conservative management including physical therapy attempts, home exercise program, and steroid injections.  At this point his pain is bothersome and interfering with his quality of life and his work.  Plan to proceed with MRI arthrogram to further characterize structural abnormalities including suspected labrum tear and for surgical planning.  Recheck after MRI.   PDMP not reviewed this encounter. Orders Placed This Encounter  Procedures  . DG Shoulder Right    Standing Status:   Future    Number of Occurrences:   1    Standing Expiration Date:   04/16/2022    Order Specific Question:   Reason for Exam (SYMPTOM  OR DIAGNOSIS REQUIRED)    Answer:   right shoulder pain    Order Specific Question:   Preferred imaging location?    Answer:   [name]  . MR Shoulder Right W Contrast    MRI arthrogram only. No IV contrast.  Schedule with Dr T 1 hour prior to MRI for injection.    Standing Status:   Future    Standing Expiration Date:   04/16/2022    Scheduling Instructions:     MRI arthrogram only. No IV contrast.  Schedule with Dr T 1 hour prior to MRI for injection.    Order Specific Question:   If indicated for the ordered procedure, I authorize the administration of contrast media per Radiology protocol    Answer:   Yes    Order Specific Question:   What is the patient's sedation requirement?    Answer:   No Sedation    Order Specific Question:   Does the patient have a pacemaker or implanted devices?    Answer:   No    Order Specific Question:   Preferred imaging location?    Answer:   [facility] (table  limit-350lbs)   No orders of the defined types were placed in this encounter.    Discussed warning signs or symptoms. Please see discharge instructions. Patient expresses understanding.   The above documentation has been reviewed and is accurate and complete Clementeen Graham, M.D.

## 2021-04-16 ENCOUNTER — Ambulatory Visit: Payer: Self-pay

## 2021-04-16 ENCOUNTER — Other Ambulatory Visit: Payer: Self-pay

## 2021-04-16 ENCOUNTER — Encounter: Payer: Self-pay | Admitting: Family Medicine

## 2021-04-16 ENCOUNTER — Ambulatory Visit (INDEPENDENT_AMBULATORY_CARE_PROVIDER_SITE_OTHER): Payer: 59 | Admitting: Family Medicine

## 2021-04-16 ENCOUNTER — Ambulatory Visit (INDEPENDENT_AMBULATORY_CARE_PROVIDER_SITE_OTHER): Payer: 59

## 2021-04-16 VITALS — BP 116/82 | HR 75 | Ht 68.5 in | Wt 193.4 lb

## 2021-04-16 DIAGNOSIS — M25311 Other instability, right shoulder: Secondary | ICD-10-CM

## 2021-04-16 DIAGNOSIS — M7551 Bursitis of right shoulder: Secondary | ICD-10-CM

## 2021-04-16 DIAGNOSIS — S43001A Unspecified subluxation of right shoulder joint, initial encounter: Secondary | ICD-10-CM | POA: Diagnosis not present

## 2021-04-16 NOTE — Patient Instructions (Addendum)
Thank you for coming in today.   Please get an Xray today before you leave   You should hear from MRI scheduling within 1 week. If you do not hear please let me know.    Recheck after the MRI.    

## 2021-04-19 MED ORDER — DICLOFENAC SODIUM 75 MG PO TBEC: 75.0000 mg | DELAYED_RELEASE_TABLET | Freq: Two times a day (BID) | ORAL | 0 refills | Status: DC | PRN

## 2021-04-19 NOTE — Progress Notes (Signed)
Right shoulder x-ray looks normal to radiology

## 2021-04-27 ENCOUNTER — Ambulatory Visit (INDEPENDENT_AMBULATORY_CARE_PROVIDER_SITE_OTHER): Payer: 59

## 2021-04-27 ENCOUNTER — Ambulatory Visit (INDEPENDENT_AMBULATORY_CARE_PROVIDER_SITE_OTHER): Payer: 59 | Admitting: Sports Medicine

## 2021-04-27 ENCOUNTER — Other Ambulatory Visit: Payer: Self-pay

## 2021-04-27 DIAGNOSIS — S43001A Unspecified subluxation of right shoulder joint, initial encounter: Secondary | ICD-10-CM | POA: Diagnosis not present

## 2021-04-27 DIAGNOSIS — M25311 Other instability, right shoulder: Secondary | ICD-10-CM | POA: Diagnosis not present

## 2021-04-27 NOTE — Progress Notes (Signed)
MRI arthrogram shows a small tear of the labrum but probably not enough to cause significant shoulder dislocations or subluxation.  You have some bursitis in the shoulder as well.  The rotator cuff tendons appear to be intact.    Recommend that you schedule a follow-up appoint with me to go over these results in full detail and discuss treatment plan and options.

## 2021-04-27 NOTE — Assessment & Plan Note (Signed)
This is a pleasant 30 year old male, multiple shoulder dislocations, persistent instability and discomfort, he is referred to me for MR arthrography, injection performed above, further management per primary treating provider.

## 2021-04-27 NOTE — Progress Notes (Signed)
    Procedures performed today:    Procedure: Real-time Ultrasound Guided gadolinium contrast injection of right glenohumeral joint Device: Samsung [id number]  Verbal informed consent obtained.  Time-out conducted.  Noted no overlying erythema, induration, or other signs of local infection.  Skin prepped in a sterile fashion.  Local anesthesia: Topical Ethyl chloride.  With sterile technique and under real time ultrasound guidance: Noted normal-appearing posterior joint and capsule, 22-gauge spinal needle advanced into the joint, I injected 1 cc kenalog 40, 2 cc lidocaine, 2 cc bupivacaine, syringe switched and 0.1 cc gadolinium injected, syringe again switched and 10 cc sterile saline used to distend the joint. Joint visualized and capsule seen distending confirming intra-articular placement of contrast material and medication. Completed without difficulty  Advised to call if fevers/chills, erythema, induration, drainage, or persistent bleeding.  Images permanently stored in PACS Impression: Technically successful ultrasound guided gadolinium contrast injection for MR arthrography.  Please see separate MR arthrogram report.   Independent interpretation of notes and tests performed by another provider:   None.  Brief History, Exam, Impression, and Recommendations:    Shoulder instability, right This is a pleasant 30 year old male, multiple shoulder dislocations, persistent instability and discomfort, he is referred to me for MR arthrography, injection performed above, further management per primary treating provider.    ___________________________________________ Xavier Cochran. Benjamin Stain, M.D., ABFM., CAQSM. Primary Care and Sports Medicine [facility]  Adjunct Instructor of Family Medicine  [employer]

## 2021-04-29 ENCOUNTER — Ambulatory Visit (INDEPENDENT_AMBULATORY_CARE_PROVIDER_SITE_OTHER): Payer: 59 | Admitting: Family Medicine

## 2021-04-29 ENCOUNTER — Other Ambulatory Visit: Payer: Self-pay

## 2021-04-29 DIAGNOSIS — S43431A Superior glenoid labrum lesion of right shoulder, initial encounter: Secondary | ICD-10-CM

## 2021-04-29 DIAGNOSIS — M25311 Other instability, right shoulder: Secondary | ICD-10-CM | POA: Diagnosis not present

## 2021-04-29 NOTE — Patient Instructions (Signed)
Thank you for coming in today.    

## 2021-04-29 NOTE — Progress Notes (Signed)
Virtual Visit  via phoneNote   I connected with Xavier Cochran.  today by a phone enabled telemedicine application and verified that I am speaking with the correct person using two identifiers.  ? Location of the provider office ? Location of the patient home ? The names and roles of all persons participating in the visit.  Patient and myself   I discussed the limitations, risks, security and privacy concerns of performing an evaluation and management service by telephone and the availability of in person appointments. I also discussed with the patient that there may be a patient responsible charge related to this service. The patient expressed understanding and agreed to proceed.    I discussed the limitations of evaluation and management by telemedicine and the availability of in person appointments. The patient expressed understanding and agreed to proceed.  History of Present Illness: Xavier Wence. is a 30 y.o. male who would like to discuss con't R shoulder pain after 2 prior shoulder dislocations in 2011 and 2014.  He had a R shoulder MRI w/ contrast on 04/27/21 and would like to discuss these results.  He has had extensive trials of physical therapy over the last few years and ongoing continued home exercise program directed by Dr. Katrinka Blazing.  This does not work well enough and he has significant dysfunction and pain and impairment in his quality of life.   Observations/Objective: Exam:  Normal Speech.    Lab and Radiology Results No results found for this or any previous visit (from the past 72 hour(s)). MR Shoulder Right W Contrast  Result Date: 04/27/2021 CLINICAL DATA:  Right shoulder pain and clicking for the past 2 months. Remote history of prior dislocation. No prior surgery. EXAM: MR ARTHROGRAM OF THE RIGHT SHOULDER TECHNIQUE: Multiplanar, multisequence MR imaging of the right shoulder was performed following the administration of intra-articular contrast. CONTRAST:  See  Injection Documentation. COMPARISON:  Right shoulder x-rays dated Apr 16, 2021. FINDINGS: Rotator cuff:  Intact without significant tendinosis. Muscles:  No focal muscular atrophy or edema. Biceps long head:  Intact and normally positioned. Acromioclavicular Joint: The acromion is type II. Normal acromioclavicular joint. Small amount of fluid but no contrast in the subacromial/subdeltoid bursa. Glenohumeral Joint: Distended with intra-articular contrast. No chondral defect. Labrum: Small superior labral tear at the biceps labral anchor (series 3, image 10). Bones: No acute fracture or dislocation. No suspicious bone lesion. Other: None. IMPRESSION: 1. Small superior labral tear at the biceps labral anchor. 2. Intact rotator cuff. 3. Mild subacromial/subdeltoid bursitis. Electronically Signed   By: Xavier Cochran M.D.   On: 04/27/2021 13:44   [place] LIMITED JOINT SPACE STRUCTURES UP RIGHT  Result Date: 04/27/2021 Procedure: Real-time Ultrasound Guided gadolinium contrast injection of right glenohumeral joint Device: Samsung [id number] Verbal informed consent obtained. Time-out conducted. Noted no overlying erythema, induration, or other signs of local infection. Skin prepped in a sterile fashion. Local anesthesia: Topical Ethyl chloride. With sterile technique and under real time ultrasound guidance: Noted normal-appearing posterior joint and capsule, 22-gauge spinal needle advanced into the joint, I injected 1 cc kenalog 40, 2 cc lidocaine, 2 cc bupivacaine, syringe switched and 0.1 cc gadolinium injected, syringe again switched and 10 cc sterile saline used to distend the joint. Joint visualized and capsule seen distending confirming intra-articular placement of contrast material and medication. Completed without difficulty Advised to call if fevers/chills, erythema, induration, drainage, or persistent bleeding. Images permanently stored in PACS Impression: Technically successful ultrasound guided gadolinium contrast  injection for MR arthrography.  Please see separate MR arthrogram report.   I, Clementeen Graham, personally (independently) visualized and performed the interpretation of the MRI images attached in this note.   Assessment and Plan: 30 y.o. male with right shoulder instability and pain.  Patient has a fundamental mismatch between the severity of his symptoms and the severity of his MRI.  He does have a superior labrum tear which probably is causing his mechanical shoulder symptoms.  He has had a pretty extensive trial of physical therapy prior to his evaluation with me in May.  He is ready to have a surgical consultation to discuss surgical options which I think is reasonable.  Plan to refer to orthopedic surgery now.  Discussed that his best option may be more physical therapy but he would like to have surgical consultation before proceeding with more PT which is a reasonable choice.  Recheck with me as needed.  PDMP not reviewed this encounter. Orders Placed This Encounter  Procedures  . Ambulatory referral to Orthopedic Surgery    Referral Priority:   Routine    Referral Type:   Surgical    Referral Reason:   Specialty Services Required    Requested Specialty:   Orthopedic Surgery    Number of Visits Requested:   1   No orders of the defined types were placed in this encounter.   Follow Up Instructions:    I discussed the assessment and treatment plan with the patient. The patient was provided an opportunity to ask questions and all were answered. The patient agreed with the plan and demonstrated an understanding of the instructions.   The patient was advised to call back or seek an in-person evaluation if the symptoms worsen or if the condition fails to improve as anticipated.  Time: 11-minute discussion    Historical information moved to improve visibility of documentation.  Past Medical History:  Diagnosis Date  . Allergic rhinitis, cause unspecified    Past Surgical History:   Procedure Laterality Date  . NO PAST SURGERIES     Social History   Tobacco Use  . Smoking status: Never Smoker  . Smokeless tobacco: Never Used  Substance Use Topics  . Alcohol use: Yes    Comment: occasional   family history includes Lupus in his paternal grandmother.  Medications: Current Outpatient Medications  Medication Sig Dispense Refill  . diclofenac (VOLTAREN) 75 MG EC tablet Take 1 tablet (75 mg total) by mouth 2 (two) times daily as needed. 30 tablet 0  . METAMUCIL FIBER PO Take 1 Dose by mouth daily.    . Multiple Vitamin (MULTIVITAMIN) tablet Take 1 tablet by mouth daily.     No current facility-administered medications for this visit.   Allergies  Allergen Reactions  . Doxycycline Nausea Only

## 2021-05-12 ENCOUNTER — Ambulatory Visit (INDEPENDENT_AMBULATORY_CARE_PROVIDER_SITE_OTHER): Payer: 59 | Admitting: Orthopedic Surgery

## 2021-05-12 ENCOUNTER — Other Ambulatory Visit: Payer: Self-pay

## 2021-05-12 DIAGNOSIS — M25311 Other instability, right shoulder: Secondary | ICD-10-CM

## 2021-05-13 ENCOUNTER — Encounter: Payer: Self-pay | Admitting: Orthopedic Surgery

## 2021-05-13 NOTE — Progress Notes (Signed)
Office Visit Note   Patient: Xavier Cochran.           Date of Birth: 11-09-1991           MRN: [id number] Visit Date: 05/12/2021 Requested by: [name], MD [address] [place],  [place] [address] PCP: [name], MD  Subjective: Chief Complaint  Patient presents with   Right Shoulder - Pain    HPI: Xavier Menz. is a 30 y.o. male who presents to the office complaining of right shoulder pain and instability.  Patient complains of lateral and anterior shoulder pain.  He has daily numbness in the right shoulder and feels the need to elevate his shoulder to help with this numbness.  He has a history of multiple dislocations with first anterior dislocation in 2011 when playing football and second and most recent dislocation in 2014 when he slipped on ice.  First dislocation reduced by the trainer.  Second dislocation reduced in the emergency department by his report..  He has pain and wakes him up at night and unable to lay on his right side.  He complains of feelings of instability and has to guard his shoulder.  He has had 10 sessions of physical therapy that provided no help to his symptoms but he did have a cortisone injection by Dr. Katrinka Blazing that helped for 6 to 7 months.  He enjoys riding ATVs, dirt bikes, fishing.  He is a father with a 20-year-old son and a daughter on the way.  He has no history of surgery on the right shoulder.  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              ROS: All systems reviewed are negative as they relate to the chief complaint within the history of present illness.  Patient denies fevers or chills.  Assessment & Plan: Visit Diagnoses:  1. Shoulder instability, right     Plan: Patient is a 30 year old male who presents complaining of right shoulder pain dislocation.  He has had constant symptoms  over the last several years with history of 2 prior shoulder dislocations.  He has symptomatic instability and feels his shoulder wants to come out when he lifts his arm up so he tries to keep his arm by his side.  Positive anterior apprehension sign on exam.  Negative posterior apprehension.  He has had MRI of the right shoulder that was reviewed today and does have a small superior labral tear at the biceps anchor but no rotator cuff damage, Hill-Sachs lesion, patulous capsule.  Based on his examination, impression is that patient has had multiple anterior dislocations of the right shoulder.  Plan for arthroscopic labral repair of the right shoulder.  This would likely involve the anterior superior labrum and anterior inferior labrum.  MRI scan does show some redundant capsule anteriorly consistent with sequelae of healed capsular labral complex injury.  He does not have much of a component of inferior instability or multidirectional instability on examination today.  Discussed the risks and benefits of the procedure including the risk of nerve/vessel damage, persistent shoulder stiffness, recurrent shoulder instability, need for revision surgery, medical complication from surgery.  He understands there is a rehabilitation period after surgery and he will have to work hard in order to achieve full range of motion.  In general Xavier Cochran does have symptoms of instability but pain is his more pressing complaint.  This type of surgery is more predictable at alleviating instability as opposed to pain relief.  Nonetheless he is very limited with overhead activity and has had 2 episodes of anterior instability with continued symptoms since that time.  Capsular labral repair and tightening indicated in this case give the best chance at having a less painful and more stable shoulder for physical activity.  Follow-Up Instructions: No follow-ups on file.   Orders:  No orders of the defined types were placed in this  encounter.  No orders of the defined types were placed in this encounter.     Procedures: No procedures performed   Clinical Data: No additional findings.  Objective: Vital Signs: There were no vitals taken for this visit.  Physical Exam:  Constitutional: Patient appears well-developed HEENT:  Head: Normocephalic Eyes:EOM are normal Neck: Normal range of motion Cardiovascular: Normal rate Pulmonary/chest: Effort normal Neurologic: Patient is alert Skin: Skin is warm Psychiatric: Patient has normal mood and affect  Ortho Exam: Ortho exam demonstrates left shoulder with 50 degrees external rotation, 120 degrees abduction, 20 degrees with flexion.  This compared with the right shoulder with 50 degrees external rotation, 95 degrees abduction, 145 degrees forward flexion.  Excellent rotator cuff strength of the right shoulder with no weakness of supra, infra, subscap.  5/5 motor strength of the bilateral for strength, finger abduction, pronation/supination, bicep, tricep, deltoid.  No atrophy of the periscapular musculature.  Axillary nerve intact with deltoid firing.  Positive apprehension sign when patient's arm is placed in abduction and external rotation.  Apprehension improves with posterior directed force over the humeral head.  Negative posterior apprehension.  Equivocal O'Brien's testing on the right negative on the left.  Specialty Comments:  No specialty comments available.  Imaging: No results found.   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   MCL sprain of right knee 08/15/2019   Acute prostatitis 03/06/2019   Hemorrhoid 03/06/2019   Acute shoulder bursitis, right 02/27/2019   Ganglion cyst of foot 02/27/2019   Asthma 04/28/2018    Constipation 07/08/2016   Right knee pain 05/29/2015   Shoulder instability, right 01/17/2015   Right shoulder pain 02/03/2014   Proteinuria 11/15/2013   Right inguinal hernia 09/27/2013   DOE (dyspnea on exertion) 06/10/2013   Venereal disease, unspecified 11/02/2012   Allergic rhinitis    External hemorrhoid, thrombosed 10/24/2012   Osgood-Schlatter's disease 10/28/2011   Encounter for well adult exam with abnormal findings 10/22/2011   Past Medical History:  Diagnosis Date   Allergic rhinitis, cause unspecified     Family History  Problem Relation Age of Onset   Lupus Paternal Grandmother     Past Surgical History:  Procedure Laterality Date   NO PAST SURGERIES     Social History   Occupational History   Occupation: Audiological scientist  Tobacco Use   Smoking status: Never   Smokeless tobacco: Never  Vaping Use   Vaping Use: Never used  Substance and Sexual Activity   Alcohol use: Yes    Comment: occasional   Drug use: No   Sexual activity: Not on file

## 2021-05-17 ENCOUNTER — Other Ambulatory Visit: Payer: Self-pay

## 2021-05-25 ENCOUNTER — Ambulatory Visit: Payer: 59 | Admitting: Internal Medicine

## 2021-05-26 NOTE — Progress Notes (Signed)
Surgical Instructions    Your procedure is scheduled on 06/01/21   Report to [facility] Main Entrance "A" at 1:30 pm, then check in with the Admitting office.  Call this number if you have problems the morning of surgery:  [phone]   If you have any questions prior to your surgery date call [phone]: Open Monday-Friday 8am-4pm    Remember:  Do not eat after midnight the night before your surgery  You may drink clear liquids until 12:30 pm the morning of your surgery.   Clear liquids allowed are: Water, Non-Citrus Juices (without pulp), Carbonated Beverages, Clear Tea, Black Coffee Only, and Gatorade     Enhanced Recovery after Surgery for Orthopedics Enhanced Recovery after Surgery is a protocol used to improve the stress on your body and your recovery after surgery.  Patient Instructions  The day of surgery (if you do NOT have diabetes):  Drink ONE (1) Pre-Surgery Clear Ensure by 12:30 pm the morning of surgery   This drink was given to you during your hospital  pre-op appointment visit. Nothing else to drink after completing the  Pre-Surgery Clear Ensure. If you have questions, please contact your surgeon's office.   Take these medicines the morning of surgery if needed Carboxymethylcellul-Glycerin eye drops     As of today, STOP taking any Aspirin (unless otherwise instructed by your surgeon) Aleve, Naproxen, Ibuprofen, Motrin, Advil, Goody's, BC's, all herbal medications, fish oil, and all vitamins.          Do not wear jewelry  Do not wear lotions, powders, colognes, or deodorant. Do not shave 48 hours prior to surgery.  Men may shave face and neck. Do not bring valuables to the hospital. DO Not wear nail polish, gel polish, artificial nails, or any other type of covering on  natural nails including finger and toenails. If patients have artificial nails, gel coating, etc. that need to be removed by a nail salon please have this removed prior to surgery or surgery  may need to be canceled/delayed if the surgeon/ anesthesia feels like the patient is unable to be adequately monitored.             [facility] is not responsible for any belongings or valuables.  Do NOT Smoke (Tobacco/Vaping) or drink Alcohol 24 hours prior to your procedure If you use a CPAP at night, you may bring all equipment for your overnight stay.   Contacts, glasses, dentures or bridgework may not be worn into surgery, please bring cases for these belongings   For patients admitted to the hospital, discharge time will be determined by your treatment team.   Patients discharged the day of surgery will not be allowed to drive home, and someone needs to stay with them for 24 hours.  ONLY 1 SUPPORT PERSON MAY BE PRESENT WHILE YOU ARE IN SURGERY. IF YOU ARE TO BE ADMITTED ONCE YOU ARE IN YOUR ROOM YOU WILL BE ALLOWED TWO (2) VISITORS.  Minor children may have two parents present. Special consideration for safety and communication needs will be reviewed on a case by case basis.  Special instructions:    Oral Hygiene is also important to reduce your risk of infection.  Remember - BRUSH YOUR TEETH THE MORNING OF SURGERY WITH YOUR REGULAR TOOTHPASTE   [facility]- Preparing For Surgery  Before surgery, you can play an important role. Because skin is not sterile, your skin needs to be as free of germs as possible. You can reduce the number of  germs on your skin by washing with CHG (chlorahexidine gluconate) Soap before surgery.  CHG is an antiseptic cleaner which kills germs and bonds with the skin to continue killing germs even after washing.     Please do not use if you have an allergy to CHG or antibacterial soaps. If your skin becomes reddened/irritated stop using the CHG.  Do not shave (including legs and underarms) for at least 48 hours prior to first CHG shower. It is OK to shave your face.  Please follow these instructions carefully.     Shower the NIGHT BEFORE SURGERY and the  MORNING OF SURGERY with CHG Soap.   If you chose to wash your hair, wash your hair first as usual with your normal shampoo. After you shampoo, rinse your hair and body thoroughly to remove the shampoo.  Then [employer] and genitals (private parts) with your normal soap and rinse thoroughly to remove soap.  After that Use CHG Soap as you would any other liquid soap. You can apply CHG directly to the skin and wash gently with a scrungie or a clean washcloth.   Apply the CHG Soap to your body ONLY FROM THE NECK DOWN.  Do not use on open wounds or open sores. Avoid contact with your eyes, ears, mouth and genitals (private parts). Wash Face and genitals (private parts)  with your normal soap.   Wash thoroughly, paying special attention to the area where your surgery will be performed.  Thoroughly rinse your body with warm water from the neck down.  DO NOT shower/wash with your normal soap after using and rinsing off the CHG Soap.  Pat yourself dry with a CLEAN TOWEL.  Wear CLEAN PAJAMAS to bed the night before surgery  Place CLEAN SHEETS on your bed the night before your surgery  DO NOT SLEEP WITH PETS.   Day of Surgery:  Take a shower with CHG soap. Wear Clean/Comfortable clothing the morning of surgery Do not apply any deodorants/lotions.   Remember to brush your teeth WITH YOUR REGULAR TOOTHPASTE.   Please read over the following fact sheets that you were given.

## 2021-05-27 ENCOUNTER — Inpatient Hospital Stay (HOSPITAL_COMMUNITY)
Admission: RE | Admit: 2021-05-27 | Discharge: 2021-05-27 | Disposition: A | Discharge status: Home / Self Care | Payer: 59 | Source: Ambulatory Visit

## 2021-05-28 ENCOUNTER — Other Ambulatory Visit: Payer: Self-pay

## 2021-05-28 NOTE — Progress Notes (Addendum)
PCP - [name] MD Cardiologist - denies  PPM/ICD - denies  Device Orders -  Rep Notified -   Chest x-ray - none  EKG - none Stress Test - none ECHO - none Cardiac Cath - none  Sleep Study - denies CPAP -   Fasting Blood Sugar - n/a Checks Blood Sugar _____ times a day  Blood Thinner Instructions:n/a  Aspirin Instructions:n/a  ERAS Protcol - clear liquids until 1230 pm. Nothing to eat after midnight the night before your surgery.  PRE-SURGERY Ensure or G2- no  COVID TEST- n/a ambulatory surgery   Anesthesia review: no    All instructions explained to the patient, with a verbal understanding of the material The opportunity to ask questions was provided.

## 2021-06-01 ENCOUNTER — Ambulatory Visit (HOSPITAL_COMMUNITY)
Admission: RE | Admit: 2021-06-01 | Discharge: 2021-06-01 | Disposition: A | Discharge status: Home / Self Care | Payer: 59 | Source: Ambulatory Visit | Attending: Orthopedic Surgery | Admitting: Orthopedic Surgery

## 2021-06-01 ENCOUNTER — Ambulatory Visit (HOSPITAL_COMMUNITY): Payer: 59 | Admitting: Anesthesiology

## 2021-06-01 ENCOUNTER — Encounter (HOSPITAL_COMMUNITY): Payer: Self-pay | Admitting: Orthopedic Surgery

## 2021-06-01 ENCOUNTER — Other Ambulatory Visit: Payer: Self-pay

## 2021-06-01 ENCOUNTER — Encounter (HOSPITAL_COMMUNITY)
Admission: RE | Disposition: A | Discharge status: Home / Self Care | Payer: Self-pay | Source: Ambulatory Visit | Attending: Orthopedic Surgery

## 2021-06-01 DIAGNOSIS — Z79899 Other long term (current) drug therapy: Secondary | ICD-10-CM | POA: Insufficient documentation

## 2021-06-01 DIAGNOSIS — Z881 Allergy status to other antibiotic agents status: Secondary | ICD-10-CM | POA: Insufficient documentation

## 2021-06-01 DIAGNOSIS — M25311 Other instability, right shoulder: Secondary | ICD-10-CM | POA: Insufficient documentation

## 2021-06-01 DIAGNOSIS — Z791 Long term (current) use of non-steroidal anti-inflammatories (NSAID): Secondary | ICD-10-CM | POA: Diagnosis not present

## 2021-06-01 HISTORY — PX: SHOULDER ARTHROSCOPY WITH LABRAL REPAIR: SHX5691

## 2021-06-01 LAB — CBC
HCT: 45.9 % (ref 39.0–52.0)
Hemoglobin: 15 g/dL (ref 13.0–17.0)
MCH: 27.2 pg (ref 26.0–34.0)
MCHC: 32.7 g/dL (ref 30.0–36.0)
MCV: 83.3 fL (ref 80.0–100.0)
Platelets: 256 10*3/uL (ref 150–400)
RBC: 5.51 MIL/uL (ref 4.22–5.81)
RDW: 13.2 % (ref 11.5–15.5)
WBC: 3.2 10*3/uL — ABNORMAL LOW (ref 4.0–10.5)
nRBC: 0 % (ref 0.0–0.2)

## 2021-06-01 LAB — BASIC METABOLIC PANEL
Anion gap: 8 (ref 5–15)
BUN: 10 mg/dL (ref 6–20)
CO2: 25 mmol/L (ref 22–32)
Calcium: 9.2 mg/dL (ref 8.9–10.3)
Chloride: 102 mmol/L (ref 98–111)
Creatinine, Ser: 1.08 mg/dL (ref 0.61–1.24)
GFR, Estimated: >60 mL/min (ref >60)
Glucose, Bld: 95 mg/dL (ref 70–99)
Potassium: 3.3 mmol/L — ABNORMAL LOW (ref 3.5–5.1)
Sodium: 135 mmol/L (ref 135–145)

## 2021-06-01 SURGERY — ARTHROSCOPY, SHOULDER, WITH GLENOID LABRUM REPAIR
Anesthesia: General | Site: Shoulder | Laterality: Right

## 2021-06-01 MED ORDER — OXYCODONE HCL 5 MG PO TABS
5.0000 mg | ORAL_TABLET | Freq: Once | ORAL | Status: AC | PRN
  Administered 2021-06-01: 5 mg via ORAL

## 2021-06-01 MED ORDER — OXYCODONE-ACETAMINOPHEN 5-325 MG PO TABS: 1.0000 | ORAL_TABLET | ORAL | 0 refills | Status: DC | PRN

## 2021-06-01 MED ORDER — METHOCARBAMOL 500 MG PO TABS
ORAL_TABLET | ORAL | Status: AC
  Filled 2021-06-01: qty 1

## 2021-06-01 MED ORDER — ORAL CARE MOUTH RINSE: 15.0000 mL | Freq: Once | OROMUCOSAL | Status: AC

## 2021-06-01 MED ORDER — MIDAZOLAM HCL 2 MG/2ML IJ SOLN
INTRAMUSCULAR | Status: AC
  Administered 2021-06-01: 2 mg via INTRAVENOUS
  Filled 2021-06-01: qty 2

## 2021-06-01 MED ORDER — LIDOCAINE 2% (20 MG/ML) 5 ML SYRINGE
INTRAMUSCULAR | Status: DC | PRN
  Administered 2021-06-01: 60 mg via INTRAVENOUS

## 2021-06-01 MED ORDER — EPINEPHRINE PF 1 MG/ML IJ SOLN
INTRAMUSCULAR | Status: AC
  Filled 2021-06-01: qty 1

## 2021-06-01 MED ORDER — BUPIVACAINE-EPINEPHRINE (PF) 0.5% -1:200000 IJ SOLN
INTRAMUSCULAR | Status: DC | PRN
  Administered 2021-06-01: 15 mL via PERINEURAL

## 2021-06-01 MED ORDER — OXYCODONE HCL 5 MG PO TABS
ORAL_TABLET | ORAL | Status: AC
  Filled 2021-06-01: qty 1

## 2021-06-01 MED ORDER — MIDAZOLAM HCL 5 MG/5ML IJ SOLN
INTRAMUSCULAR | Status: DC | PRN
  Administered 2021-06-01: 2 mg via INTRAVENOUS

## 2021-06-01 MED ORDER — POVIDONE-IODINE 10 % EX SWAB
2.0000 "application " | Freq: Once | CUTANEOUS | Status: AC
  Administered 2021-06-01: 2 via TOPICAL

## 2021-06-01 MED ORDER — BUPIVACAINE LIPOSOME 1.3 % IJ SUSP
INTRAMUSCULAR | Status: DC | PRN
  Administered 2021-06-01: 10 mL via PERINEURAL

## 2021-06-01 MED ORDER — ACETAMINOPHEN 160 MG/5ML PO SOLN: 325.0000 mg | ORAL | Status: DC | PRN

## 2021-06-01 MED ORDER — LACTATED RINGERS IV SOLN: INTRAVENOUS | Status: DC | PRN

## 2021-06-01 MED ORDER — CEFAZOLIN SODIUM-DEXTROSE 2-4 GM/100ML-% IV SOLN
2.0000 g | INTRAVENOUS | Status: AC
  Administered 2021-06-01: 2 g via INTRAVENOUS
  Filled 2021-06-01: qty 100

## 2021-06-01 MED ORDER — PROPOFOL 10 MG/ML IV BOLUS
INTRAVENOUS | Status: AC
  Filled 2021-06-01: qty 40

## 2021-06-01 MED ORDER — FENTANYL CITRATE (PF) 100 MCG/2ML IJ SOLN: 25.0000 ug | INTRAMUSCULAR | Status: DC | PRN

## 2021-06-01 MED ORDER — FENTANYL CITRATE (PF) 100 MCG/2ML IJ SOLN
INTRAMUSCULAR | Status: AC
  Administered 2021-06-01: 100 ug via INTRAVENOUS
  Filled 2021-06-01: qty 2

## 2021-06-01 MED ORDER — EPINEPHRINE PF 1 MG/ML IJ SOLN
INTRAMUSCULAR | Status: DC | PRN
  Administered 2021-06-01 (×4): 1 mg

## 2021-06-01 MED ORDER — METHOCARBAMOL 500 MG PO TABS
500.0000 mg | ORAL_TABLET | Freq: Once | ORAL | Status: AC
  Administered 2021-06-01: 500 mg via ORAL

## 2021-06-01 MED ORDER — PROPOFOL 10 MG/ML IV BOLUS
INTRAVENOUS | Status: DC | PRN
  Administered 2021-06-01: 200 mg via INTRAVENOUS

## 2021-06-01 MED ORDER — POVIDONE-IODINE 7.5 % EX SOLN
Freq: Once | CUTANEOUS | Status: DC
  Filled 2021-06-01: qty 118

## 2021-06-01 MED ORDER — OXYCODONE HCL 5 MG/5ML PO SOLN: 5.0000 mg | Freq: Once | ORAL | Status: AC | PRN

## 2021-06-01 MED ORDER — DICLOFENAC SODIUM 75 MG PO TBEC: 75.0000 mg | DELAYED_RELEASE_TABLET | Freq: Two times a day (BID) | ORAL | 0 refills | Status: DC | PRN

## 2021-06-01 MED ORDER — FENTANYL CITRATE (PF) 100 MCG/2ML IJ SOLN
INTRAMUSCULAR | Status: DC | PRN
  Administered 2021-06-01 (×2): 50 ug via INTRAVENOUS

## 2021-06-01 MED ORDER — SUGAMMADEX SODIUM 200 MG/2ML IV SOLN
INTRAVENOUS | Status: DC | PRN
  Administered 2021-06-01: 300 mg via INTRAVENOUS

## 2021-06-01 MED ORDER — METHOCARBAMOL 500 MG PO TABS: 500.0000 mg | ORAL_TABLET | Freq: Three times a day (TID) | ORAL | 0 refills | Status: DC | PRN

## 2021-06-01 MED ORDER — EPINEPHRINE PF 1 MG/ML IJ SOLN
INTRAMUSCULAR | Status: AC
  Filled 2021-06-01: qty 3

## 2021-06-01 MED ORDER — ACETAMINOPHEN 325 MG PO TABS: 325.0000 mg | ORAL_TABLET | ORAL | Status: DC | PRN

## 2021-06-01 MED ORDER — LACTATED RINGERS IV SOLN: INTRAVENOUS | Status: DC

## 2021-06-01 MED ORDER — ONDANSETRON HCL 4 MG/2ML IJ SOLN: 4.0000 mg | Freq: Once | INTRAMUSCULAR | Status: DC | PRN

## 2021-06-01 MED ORDER — FENTANYL CITRATE (PF) 100 MCG/2ML IJ SOLN
100.0000 ug | Freq: Once | INTRAMUSCULAR | Status: AC
Start: 2021-06-01 — End: 2021-06-01

## 2021-06-01 MED ORDER — ROCURONIUM BROMIDE 10 MG/ML (PF) SYRINGE
PREFILLED_SYRINGE | INTRAVENOUS | Status: DC | PRN
  Administered 2021-06-01: 60 mg via INTRAVENOUS
  Administered 2021-06-01 (×2): 20 mg via INTRAVENOUS

## 2021-06-01 MED ORDER — DEXAMETHASONE SODIUM PHOSPHATE 10 MG/ML IJ SOLN
INTRAMUSCULAR | Status: DC | PRN
  Administered 2021-06-01: 10 mg via INTRAVENOUS

## 2021-06-01 MED ORDER — DEXMEDETOMIDINE (PRECEDEX) IN NS 20 MCG/5ML (4 MCG/ML) IV SYRINGE
PREFILLED_SYRINGE | INTRAVENOUS | Status: DC | PRN
  Administered 2021-06-01: 12 ug via INTRAVENOUS

## 2021-06-01 MED ORDER — MIDAZOLAM HCL 2 MG/2ML IJ SOLN
INTRAMUSCULAR | Status: AC
  Filled 2021-06-01: qty 2

## 2021-06-01 MED ORDER — MIDAZOLAM HCL 2 MG/2ML IJ SOLN: 2.0000 mg | Freq: Once | INTRAMUSCULAR | Status: AC

## 2021-06-01 MED ORDER — SODIUM CHLORIDE 0.9 % IR SOLN
Status: DC | PRN
  Administered 2021-06-01: 500 mL
  Administered 2021-06-01 (×2): 3000 mL
  Administered 2021-06-01: 6000 mL
  Administered 2021-06-01 (×2): 3000 mL

## 2021-06-01 MED ORDER — CHLORHEXIDINE GLUCONATE 0.12 % MT SOLN
15.0000 mL | Freq: Once | OROMUCOSAL | Status: AC
  Administered 2021-06-01: 15 mL via OROMUCOSAL
  Filled 2021-06-01: qty 15

## 2021-06-01 MED ORDER — FENTANYL CITRATE (PF) 250 MCG/5ML IJ SOLN
INTRAMUSCULAR | Status: AC
  Filled 2021-06-01: qty 5

## 2021-06-01 MED ORDER — MEPERIDINE HCL 25 MG/ML IJ SOLN: 6.2500 mg | INTRAMUSCULAR | Status: DC | PRN

## 2021-06-01 MED ORDER — ONDANSETRON HCL 4 MG/2ML IJ SOLN
INTRAMUSCULAR | Status: DC | PRN
  Administered 2021-06-01: 4 mg via INTRAVENOUS

## 2021-06-01 SURGICAL SUPPLY — 56 items
ANCHOR SUT 1.8 FBRTK KNTLS 2SU (Anchor) ×6 IMPLANT
ANCHOR SUT 1.8 FIBERTAK SB KL (Anchor) ×2 IMPLANT
BAG COUNTER SPONGE SURGICOUNT (BAG) ×2 IMPLANT
BLADE CUTTER GATOR 3.5 (BLADE) IMPLANT
BLADE EXCALIBUR 4.0X13 (MISCELLANEOUS) IMPLANT
BLADE SURG 11 STRL SS (BLADE) ×2 IMPLANT
CANNULA 5.75X71 LONG (CANNULA) ×4 IMPLANT
CANNULA TWIST IN 8.25X7CM (CANNULA) ×4 IMPLANT
COOLER ICEMAN CLASSIC (MISCELLANEOUS) ×2 IMPLANT
COVER SURGICAL LIGHT HANDLE (MISCELLANEOUS) ×2 IMPLANT
DISSECTOR  3.8MM X 13CM (MISCELLANEOUS) ×2
DISSECTOR 3.8MM X 13CM (MISCELLANEOUS) ×1 IMPLANT
DRAPE INCISE IOBAN 66X45 STRL (DRAPES) ×2 IMPLANT
DRAPE STERI 35X30 U-POUCH (DRAPES) ×4 IMPLANT
DRSG TEGADERM 4X4.75 (GAUZE/BANDAGES/DRESSINGS) ×6 IMPLANT
DURAPREP 26ML APPLICATOR (WOUND CARE) ×6 IMPLANT
DW OUTFLOW CASSETTE/TUBE SET (MISCELLANEOUS) ×2 IMPLANT
ELECT REM PT RETURN 9FT ADLT (ELECTROSURGICAL) ×2
ELECTRODE REM PT RTRN 9FT ADLT (ELECTROSURGICAL) ×1 IMPLANT
FIBERSTICK 2 (SUTURE) IMPLANT
GAUZE SPONGE 4X4 12PLY STRL (GAUZE/BANDAGES/DRESSINGS) ×2 IMPLANT
GAUZE SPONGE 4X4 12PLY STRL LF (GAUZE/BANDAGES/DRESSINGS) ×2 IMPLANT
GAUZE XEROFORM 1X8 LF (GAUZE/BANDAGES/DRESSINGS) ×2 IMPLANT
GLOVE SRG 8 PF TXTR STRL LF DI (GLOVE) ×1 IMPLANT
GLOVE SURG LTX SZ8 (GLOVE) ×2 IMPLANT
GLOVE SURG UNDER POLY LF SZ8 (GLOVE) ×2
GOWN STRL REUS W/ TWL LRG LVL3 (GOWN DISPOSABLE) ×3 IMPLANT
GOWN STRL REUS W/TWL LRG LVL3 (GOWN DISPOSABLE) ×6
KIT BASIN OR (CUSTOM PROCEDURE TRAY) ×2 IMPLANT
KIT STR SPEAR 1.8 FBRTK DISP (KITS) ×4 IMPLANT
KIT TURNOVER KIT B (KITS) ×2 IMPLANT
LASSO 90 CVE QUICKPAS (DISPOSABLE) ×2 IMPLANT
LASSO CRESCENT QUICKPASS (SUTURE) IMPLANT
MANIFOLD NEPTUNE II (INSTRUMENTS) ×2 IMPLANT
NEEDLE SPNL 18GX3.5 QUINCKE PK (NEEDLE) ×2 IMPLANT
NEEDLE SUT 2-0 SCORPION KNEE (NEEDLE) ×2 IMPLANT
NS IRRIG 1000ML POUR BTL (IV SOLUTION) ×2 IMPLANT
PACK SHOULDER (CUSTOM PROCEDURE TRAY) ×2 IMPLANT
PAD ARMBOARD 7.5X6 YLW CONV (MISCELLANEOUS) ×4 IMPLANT
PORT APPOLLO RF 90DEGREE MULTI (SURGICAL WAND) IMPLANT
SLEEVE ARM SUSPENSION SYSTEM (MISCELLANEOUS) ×2 IMPLANT
SLING ARM IMMOBILIZER XL (CAST SUPPLIES) ×2 IMPLANT
SLING S3 LATERAL DISP (MISCELLANEOUS) ×2 IMPLANT
SPONGE T-LAP 4X18 ~~LOC~~+RFID (SPONGE) ×2 IMPLANT
SUT ETHILON 3 0 PS 1 (SUTURE) ×4 IMPLANT
SUT FIBERWIRE 2-0 18 17.9 3/8 (SUTURE)
SUT VIC AB 3-0 X1 27 (SUTURE) ×2 IMPLANT
SUTURE FIBERWR 2-0 18 17.9 3/8 (SUTURE) IMPLANT
SYR 20ML LL LF (SYRINGE) ×2 IMPLANT
SYR 30ML LL (SYRINGE) ×2 IMPLANT
TAPE LABRALWHITE 1.5X36 (TAPE) IMPLANT
TAPE SUT LABRALTAP WHT/BLK (SUTURE) IMPLANT
TOWEL GREEN STERILE (TOWEL DISPOSABLE) ×2 IMPLANT
TOWEL GREEN STERILE FF (TOWEL DISPOSABLE) ×2 IMPLANT
TUBING ARTHROSCOPY IRRIG 16FT (MISCELLANEOUS) ×2 IMPLANT
WATER STERILE IRR 1000ML POUR (IV SOLUTION) ×2 IMPLANT

## 2021-06-01 NOTE — Anesthesia Procedure Notes (Signed)
Procedure Name: Intubation Date/Time: 06/01/2021 5:21 PM Performed by: Dorthea Cove, CRNA Pre-anesthesia Checklist: Patient identified, Emergency Drugs available, Suction available and Patient being monitored Patient Re-evaluated:Patient Re-evaluated prior to induction Oxygen Delivery Method: Circle system utilized Preoxygenation: Pre-oxygenation with 100% oxygen Induction Type: IV induction Ventilation: Mask ventilation without difficulty Laryngoscope Size: Mac and 4 Grade View: Grade I Tube type: Oral Tube size: 7.5 mm Number of attempts: 1 Airway Equipment and Method: Stylet and Oral airway Placement Confirmation: ETT inserted through vocal cords under direct vision, positive ETCO2 and breath sounds checked- equal and bilateral Secured at: 22 cm Tube secured with: Tape Dental Injury: Teeth and Oropharynx as per pre-operative assessment

## 2021-06-01 NOTE — Brief Op Note (Signed)
   06/01/2021  7:32 PM  PATIENT:  [name].  30 y.o. male  PRE-OPERATIVE DIAGNOSIS:  right shoulder instability  POST-OPERATIVE DIAGNOSIS:  right shoulder instability  PROCEDURE:  Procedure(s): RIGHT SHOULDER ARTHROSCOPY WITH LABRAL REPAIR INFERIOR  SURGEON:  Surgeon(s): Cammy Copa, MD  ASSISTANT: magnant pa  ANESTHESIA:   general  EBL: 5 ml    No intake/output data recorded.  BLOOD ADMINISTERED: none  DRAINS: none   LOCAL MEDICATIONS USED:  none  SPECIMEN:  No Specimen  COUNTS:  YES  TOURNIQUET:  * No tourniquets in log *  DICTATION: .Other Dictation: Dictation Number [id number]  PLAN OF CARE: Discharge to home after PACU  PATIENT DISPOSITION:  PACU - hemodynamically stable

## 2021-06-01 NOTE — Anesthesia Procedure Notes (Addendum)
Anesthesia Regional Block: Interscalene brachial plexus block   Pre-Anesthetic Checklist: , timeout performed,  Correct Patient, Correct Site, Correct Laterality,  Correct Procedure, Correct Position, site marked,  Risks and benefits discussed,  Surgical consent,  Pre-op evaluation,  At surgeon's request and post-op pain management  Laterality: Right  Prep: chloraprep       Needles:  Injection technique: Single-shot  Needle Type: Echogenic Stimulator Needle     Needle Length: 5cm  Needle Gauge: 22     Additional Needles:   Procedures:, nerve stimulator,,, ultrasound used (permanent image in chart),,     Nerve Stimulator or Paresthesia:  Response: hand, 0.45 mA  Additional Responses:   Narrative:  Start time: 06/01/2021 2:34 PM End time: 06/01/2021 2:42 PM Injection made incrementally with aspirations every 5 mL.  Performed by: Personally  Anesthesiologist: Bethena Midget, MD  Additional Notes: Functioning IV was confirmed and monitors were applied.  A 79mm 22ga Arrow echogenic stimulator needle was used. Sterile prep and drape,hand hygiene and sterile gloves were used. Ultrasound guidance: relevant anatomy identified, needle position confirmed, local anesthetic spread visualized around nerve(s)., vascular puncture avoided.  Image printed for medical record. Negative aspiration and negative test dose prior to incremental administration of local anesthetic. The patient tolerated the procedure well.

## 2021-06-01 NOTE — Transfer of Care (Signed)
Immediate Anesthesia Transfer of Care Note  Patient: Xavier Cochran.  Procedure(s) Performed: RIGHT SHOULDER ARTHROSCOPY WITH LABRAL REPAIR INFERIOR (Right: Shoulder)  Patient Location: PACU  Anesthesia Type:GA combined with regional for post-op pain  Level of Consciousness: drowsy  Airway & Oxygen Therapy: Patient Spontanous Breathing and Patient connected to nasal cannula oxygen  Post-op Assessment: Report given to RN and Post -op Vital signs reviewed and stable  Post vital signs: Reviewed and stable  Last Vitals:  Vitals Value Taken Time  BP 116/98 06/01/21 1943  Temp    Pulse 80 06/01/21 1949  Resp 27 06/01/21 1949  SpO2 97 % 06/01/21 1949  Vitals shown include unvalidated device data.  Last Pain:  Vitals:   06/01/21 1440  TempSrc:   PainSc: 0-No pain      Patients Stated Pain Goal: 2 (06/01/21 1345)  Complications: No notable events documented.

## 2021-06-01 NOTE — H&P (Signed)
Xavier Cochran. is an 30 y.o. male.   Chief Complaint: Right shoulder pain and instability HPI: Xavier Cochran. is a 30 y.o. male who is  complaining of right shoulder pain and instability.  Patient complains of lateral and anterior shoulder pain.  He has daily numbness in the right shoulder and feels the need to elevate his shoulder to help with this numbness.  He has a history of multiple dislocations with first anterior dislocation in 2011 when playing football and second and most recent dislocation in 2014 when he slipped on ice.  First dislocation reduced by the trainer.  Second dislocation reduced in the emergency department by his report..  He has pain and wakes him up at night and unable to lay on his right side.  He complains of feelings of instability and has to guard his shoulder.on a daily basis.   He has had 10 sessions of physical therapy that provided no help to his symptoms but he did have a cortisone injection by Dr. Katrinka Blazing that helped for 6 to 7 months.  He enjoys riding ATVs, dirt bikes, fishing.  He is a father with a 46-year-old son and a daughter on the way.  He has no history of surgery on the right shoulder.  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  Past Medical History:  Diagnosis Date   Allergic rhinitis, cause unspecified     Past Surgical History:  Procedure Laterality Date   NO PAST SURGERIES      Family History  Problem Relation Age of Onset   Lupus Paternal Grandmother    Social History:  reports that he has never smoked. He has never used smokeless tobacco. He reports current alcohol use. He reports that he does not use drugs.  Allergies:  Allergies  Allergen Reactions   Doxycycline Nausea Only    Medications Prior to Admission  Medication Sig Dispense Refill   Carboxymethylcellul-Glycerin (LUBRICATING EYE DROPS  OP) Place 1 drop into both eyes daily as needed (dry eyes).     Multiple Vitamin (MULTIVITAMIN) tablet Take 1 tablet by mouth daily.     Phenylephrine-Cocoa Butter (PREPARATION H RE) Place 1 application rectally daily as needed (hemorrhoids/irritation).     Phenylephrine-Ibuprofen (ADVIL SINUS CONGESTION & PAIN PO) Take 1 tablet by mouth daily as needed (pain/congestion).     diclofenac (VOLTAREN) 75 MG EC tablet Take 1 tablet (75 mg total) by mouth 2 (two) times daily as needed. (Patient not taking: Reported on 05/19/2021) 30 tablet 0    Results for orders placed or performed during the hospital encounter of 06/01/21 (from the past 48 hour(s))  CBC     Status: Abnormal   Collection Time: 06/01/21  1:30 PM  Result Value Ref Range   WBC 3.2 (L) 4.0 - 10.5 K/uL   RBC 5.51 4.22 - 5.81 MIL/uL   Hemoglobin 15.0 13.0 - 17.0 g/dL   HCT 66.4 40.3 - 47.4 %   MCV 83.3 80.0 - 100.0 fL   MCH 27.2 26.0 - 34.0 pg   MCHC 32.7 30.0 - 36.0 g/dL   RDW 25.9 56.3 - 87.5 %   Platelets 256 150 - 400 K/uL   nRBC 0.0 0.0 - 0.2 %    Comment: Performed at [facility], [address], [place], [place] [address]  Basic metabolic panel  Status: Abnormal   Collection Time: 06/01/21  1:30 PM  Result Value Ref Range   Sodium 135 135 - 145 mmol/L   Potassium 3.3 (L) 3.5 - 5.1 mmol/L   Chloride 102 98 - 111 mmol/L   CO2 25 22 - 32 mmol/L   Glucose, Bld 95 70 - 99 mg/dL    Comment: Glucose reference range applies only to samples taken after fasting for at least 8 hours.   BUN 10 6 - 20 mg/dL   Creatinine, Ser 5.95 0.61 - 1.24 mg/dL   Calcium 9.2 8.9 - 63.8 mg/dL   GFR, Estimated >75 >64 mL/min    Comment: (NOTE) Calculated using the CKD-EPI Creatinine Equation (2021)    Anion gap 8 5 - 15    Comment: Performed at [facility], [address], [place], [place] [address]   No results found.  Review of Systems  Musculoskeletal:  Positive for arthralgias.  All other systems reviewed and are  negative.  Blood pressure 131/84, pulse 88, temperature 98.6 F (37 C), temperature source Oral, resp. rate 15, height 5\' 9"  (1.753 m), weight 86.2 kg, SpO2 99 %. Physical Exam Vitals reviewed.  HENT:     Head: Normocephalic.     Nose: Nose normal.     Mouth/Throat:     Mouth: Mucous membranes are moist.  Eyes:     Pupils: Pupils are equal, round, and reactive to light.  Cardiovascular:     Rate and Rhythm: Normal rate.     Pulses: Normal pulses.  Pulmonary:     Effort: Pulmonary effort is normal.  Abdominal:     General: Abdomen is flat.  Musculoskeletal:     Cervical back: Normal range of motion.  Skin:    General: Skin is warm.     Capillary Refill: Capillary refill takes less than 2 seconds.  Neurological:     General: No focal deficit present.     Mental Status: He is alert.  Psychiatric:        Mood and Affect: Mood normal.    Ortho exam demonstrates left shoulder with 50 degrees external rotation, 120 degrees abduction, 20 degrees with flexion.  This compared with the right shoulder with 50 degrees external rotation, 95 degrees abduction, 145 degrees forward flexion.  Excellent rotator cuff strength of the right shoulder with no weakness of supra, infra, subscap.  5/5 motor strength of the bilateral for strength, finger abduction, pronation/supination, bicep, tricep, deltoid.  No atrophy of the periscapular musculature.  Axillary nerve intact with deltoid firing.  Positive apprehension sign when patient's arm is placed in abduction and external rotation.  Apprehension improves with posterior directed force over the humeral head.  Negative posterior apprehension.  Equivocal O'Brien's testing on the right negative on the left. Assessment/Plan Patient is a 30 year old male who presents complaining of right shoulder pain dislocation.  He has had constant symptoms over the last several years with history of 2 prior shoulder dislocations.  He has symptomatic instability and feels  his shoulder wants to come out when he lifts his arm up so he tries to keep his arm by his side.  Positive anterior apprehension sign on exam.  Negative posterior apprehension.  He has had MRI of the right shoulder that was reviewed today and does have a small superior labral tear at the biceps anchor but no rotator cuff damage, Hill-Sachs lesion, patulous capsule.  Based on his examination, impression is that patient has had multiple anterior dislocations of the  right shoulder.  Plan for arthroscopic labral repair of the right shoulder.  This would likely involve the anterior superior labrum and anterior inferior labrum.  MRI scan does show some redundant capsule anteriorly consistent with sequelae of healed capsular labral complex injury.  No clear evidence of HAGL lesion although the capsule is patulous inferiorly.[name]  He does not have much of a component of inferior instability or multidirectional instability on examination today.  Discussed the risks and benefits of the procedure including the risk of nerve/vessel damage, persistent shoulder stiffness, recurrent shoulder instability, need for revision surgery, medical complication from surgery.  He understands there is a rehabilitation period after surgery and he will have to work hard in order to achieve full range of motion.  In general Egon does have symptoms of instability but pain is his more pressing complaint.  This type of surgery is more predictable at alleviating instability as opposed to pain relief.  Nonetheless he is very limited with overhead activity and has had 2 episodes of anterior instability with continued symptoms since that time.  Capsular labral repair and tightening indicated in this case give the best chance at having a less painful and more stable shoulder for physical activity.  Burnard Bunting, MD 06/01/2021, 4:15 PM

## 2021-06-01 NOTE — Anesthesia Preprocedure Evaluation (Addendum)
Anesthesia Evaluation  Patient identified by MRN, date of birth, ID band Patient awake    Reviewed: Allergy & Precautions, H&P , NPO status , Patient's Chart, lab work & pertinent test results, reviewed documented beta blocker date and time   Airway Mallampati: I  TM Distance: >3 FB Neck ROM: full    Dental no notable dental hx. (+) Teeth Intact, Dental Advisory Given   Pulmonary neg pulmonary ROS,    Pulmonary exam normal breath sounds clear to auscultation       Cardiovascular Exercise Tolerance: Good negative cardio ROS   Rhythm:regular Rate:Normal     Neuro/Psych negative neurological ROS  negative psych ROS   GI/Hepatic negative GI ROS, Neg liver ROS,   Endo/Other  negative endocrine ROS  Renal/GU negative Renal ROS  negative genitourinary   Musculoskeletal   Abdominal   Peds  Hematology negative hematology ROS (+)   Anesthesia Other Findings   Reproductive/Obstetrics negative OB ROS                            Anesthesia Physical Anesthesia Plan  ASA: 2  Anesthesia Plan: General   Post-op Pain Management: GA combined w/ Regional for post-op pain   Induction: Intravenous  PONV Risk Score and Plan: 2 and Ondansetron and Dexamethasone  Airway Management Planned: Oral ETT  Additional Equipment: None  Intra-op Plan:   Post-operative Plan: Extubation in OR  Informed Consent: I have reviewed the patients History and Physical, chart, labs and discussed the procedure including the risks, benefits and alternatives for the proposed anesthesia with the patient or authorized representative who has indicated his/her understanding and acceptance.     Dental Advisory Given  Plan Discussed with: CRNA and Anesthesiologist  Anesthesia Plan Comments: (Discussed both nerve block for pain relief post-op and GA; including NV, sore throat, dental injury, and pulmonary complications)        Anesthesia Quick Evaluation

## 2021-06-01 NOTE — Anesthesia Postprocedure Evaluation (Signed)
Anesthesia Post Note  Patient: Xavier Cochran.  Procedure(s) Performed: RIGHT SHOULDER ARTHROSCOPY WITH LABRAL REPAIR INFERIOR (Right: Shoulder)     Patient location during evaluation: PACU Anesthesia Type: General and Regional Level of consciousness: awake and alert Pain management: pain level controlled Vital Signs Assessment: post-procedure vital signs reviewed and stable Respiratory status: spontaneous breathing, nonlabored ventilation and respiratory function stable Cardiovascular status: blood pressure returned to baseline and stable Postop Assessment: no apparent nausea or vomiting Anesthetic complications: no   No notable events documented.  Last Vitals:  Vitals:   06/01/21 2030 06/01/21 2045  BP: 108/73 114/84  Pulse: 76 72  Resp: 20 20  Temp: 36.6 C 36.6 C  SpO2: 97% 93%    Last Pain:  Vitals:   06/01/21 2030  TempSrc:   PainSc: 2                  Symeon Puleo,W. EDMOND

## 2021-06-02 ENCOUNTER — Telehealth: Payer: Self-pay | Admitting: Radiology

## 2021-06-02 NOTE — Telephone Encounter (Signed)
Patient called and asked how long the nerve block would last after his surgery, surg was done 06/01/21 by Dr. August Saucer, I advised that it could last over 24 hours. He said he was ok with that.

## 2021-06-02 NOTE — Op Note (Signed)
NAMECRISTOPHER, CICCARELLI MEDICAL RECORD NO: [id number] ACCOUNT NO: [id number] DATE OF BIRTH: 23-Feb-1991 FACILITY: [facility] LOCATION: [facility] PHYSICIAN: Graylin Shiver. August Saucer, MD  Operative Report   DATE OF PROCEDURE: 06/01/2021  PREOPERATIVE DIAGNOSIS:  Right shoulder instability.  POSTOPERATIVE DIAGNOSIS:  Right shoulder instability.  PROCEDURE:  Right shoulder arthroscopy with anterior inferior labral repair and imbrication.  SURGEON:  Attending, Cammy Copa, MD  ASSISTANT:  Karenann Cai, PA  INDICATIONS:  The patient is a 30 year old patient with history of 2 prior dislocations and symptomatic daily subluxation and instability and pain who presents for operative management after explanation of risks and benefits.  DESCRIPTION OF PROCEDURE:  The patient was brought to the operating room where general anesthetic was induced.  Preoperative antibiotics were administered.  Timeout was called.  The patient did have anterior instability when testing his shuck test  anteriorly with the arm in 30 degrees of abduction with distal distraction.  The patient also had increased anterior instability, 2+/3 on the right side compared to the left side at 90 degrees of abduction.  No posterior instability was present.  The  patient was placed in the lateral decubitus position with the right arm suspended in traction.  The arm was also forward flexed about 10 degrees and adducted about 45 degrees.  Collier Flowers was used to seal the operative field and cover the axilla.  At this  time, timeout was called.  Posterior portal was created 2 cm medial and inferior to the posterolateral margin of the acromion.  Anterior portal created under direct visualization.  Secondary anterior portal created at the intersection of the subscap.  At  this time, the capsule was inspected.  No HAGL lesion was present.  Rotator cuff intact.  Superior labrum intact.  Anterior inferior labrum had evidence of prior attenuation.  Next, based on history  and examination under anesthesia, decision was made to  imbricate the capsule back to the anterior inferior labrum from the 3 o'clock to 6 o'clock position.  Four knotless SutureTak anchors were placed at the 5:30, 4:30, 3:30, and 2:30 position.  Last anchor was just at the equator of the glenoid.  At this  time, the capsular bite was taken not more than 5 mm from the glenoid and imbricated beginning inferiorly and moving superiorly.  Capsular shift type of procedure was performed with good restoration of a bigger bumper and improved stability.  The  knotless SutureTaks were tightened.  Good fixation was achieved.  At this time, thorough irrigation was performed of the joint.  Portal instruments were removed.  The portals were closed using 3-0 Vicryl, 3-0 nylon.  Impervious dressing was placed.  The  patient had improved stability during the examination after the procedure.  Externally rotated to about 50 degrees before it became tight.  The patient tolerated the procedure well without immediate complication.  Placed in a shoulder immobilizer.   Luke's assistance was required for suture management, opening, closing, passing of the sutures through the labrum.  His assistance was a medical necessity.   SHW D: 06/01/2021 7:39:29 pm T: 06/01/2021 10:40:00 pm  JOB: [id number]/ [id number]

## 2021-06-03 ENCOUNTER — Encounter (HOSPITAL_COMMUNITY): Payer: Self-pay | Admitting: Orthopedic Surgery

## 2021-06-03 NOTE — Addendum Note (Signed)
Addendum  created 06/03/21 0225 by Bethena Midget, MD   Clinical Note Signed, Intraprocedure Blocks edited, SmartForm saved

## 2021-06-06 ENCOUNTER — Other Ambulatory Visit: Payer: Self-pay | Admitting: Specialist

## 2021-06-06 MED ORDER — TIZANIDINE HCL 4 MG PO TABS: 4.0000 mg | ORAL_TABLET | Freq: Four times a day (QID) | ORAL | 0 refills | Status: DC | PRN

## 2021-06-06 MED ORDER — GABAPENTIN 300 MG PO CAPS: 300.0000 mg | ORAL_CAPSULE | Freq: Three times a day (TID) | ORAL | 1 refills | Status: DC

## 2021-06-09 ENCOUNTER — Other Ambulatory Visit: Payer: Self-pay

## 2021-06-09 ENCOUNTER — Ambulatory Visit (INDEPENDENT_AMBULATORY_CARE_PROVIDER_SITE_OTHER): Payer: 59 | Admitting: Surgical

## 2021-06-09 ENCOUNTER — Encounter: Payer: Self-pay | Admitting: Surgical

## 2021-06-09 DIAGNOSIS — M25311 Other instability, right shoulder: Secondary | ICD-10-CM

## 2021-06-09 NOTE — Progress Notes (Signed)
   Post-Op Visit Note   Patient: Xavier Cochran.           Date of Birth: October 29, 1991           MRN: [id number] Visit Date: 06/09/2021 PCP: [name], MD   Assessment & Plan:  Chief Complaint:  Chief Complaint  Patient presents with   Right Shoulder - Routine Post Op   Visit Diagnoses:  1. Shoulder instability, right     Plan: Patient is a 30 year old male who presents s/p right shoulder arthroscopy with labral repair on 06/01/2021.  Doing well and pain is controlled.  Most of his pain is in the periscapular region from wearing his sling.  This is improving.  Denies any fevers, chills, night sweats, drainage from the incision.  Sutures are intact and these were removed today and replaced with Steri-Strips.  Incisions look to be healing well with no infection or dehiscence.  He has really just been sticking in the sling and not moving the arm.  Recommended he continue with this and follow-up with Dr. August Saucer for clinical recheck in 2 weeks.  On exam he does have intact axillary nerve with deltoid firing.  2+ radial pulse of the operative extremity with intact EPL, finger abduction, finger adduction, wrist extension.  Cautioned patient against crossing his arm across his body or lifting the arm up or lifting anything with the arm.  Patient understands his restrictions and will follow up in 2 weeks.  Follow-Up Instructions: No follow-ups on file.   Orders:  No orders of the defined types were placed in this encounter.  No orders of the defined types were placed in this encounter.   Imaging: No results found.  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   MCL sprain of right knee 08/15/2019   Acute prostatitis 03/06/2019   Hemorrhoid 03/06/2019   Acute  shoulder bursitis, right 02/27/2019   Ganglion cyst of foot 02/27/2019   Asthma 04/28/2018   Constipation 07/08/2016   Right knee pain 05/29/2015   Shoulder instability, right 01/17/2015   Right shoulder pain 02/03/2014   Proteinuria 11/15/2013   Right inguinal hernia 09/27/2013   DOE (dyspnea on exertion) 06/10/2013   Venereal disease, unspecified 11/02/2012   Allergic rhinitis    External hemorrhoid, thrombosed 10/24/2012   Osgood-Schlatter's disease 10/28/2011   Encounter for well adult exam with abnormal findings 10/22/2011   Past Medical History:  Diagnosis Date   Allergic rhinitis, cause unspecified     Family History  Problem Relation Age of Onset   Lupus Paternal Grandmother     Past Surgical History:  Procedure Laterality Date   NO PAST SURGERIES     SHOULDER ARTHROSCOPY WITH LABRAL REPAIR Right 06/01/2021   Procedure: RIGHT SHOULDER ARTHROSCOPY WITH LABRAL REPAIR INFERIOR;  Surgeon: Cammy Copa, MD;  Location: [facility] OR;  Service: Orthopedics;  Laterality: Right;   Social History   Occupational History   Occupation: Audiological scientist  Tobacco Use   Smoking status: Never   Smokeless tobacco: Never  Vaping Use   Vaping Use: Never used  Substance and Sexual Activity   Alcohol use: Yes    Comment: occasional   Drug use: No   Sexual activity: Not on file

## 2021-06-25 ENCOUNTER — Other Ambulatory Visit: Payer: Self-pay

## 2021-06-25 ENCOUNTER — Encounter: Payer: Self-pay | Admitting: Orthopedic Surgery

## 2021-06-25 ENCOUNTER — Ambulatory Visit (INDEPENDENT_AMBULATORY_CARE_PROVIDER_SITE_OTHER): Payer: 59 | Admitting: Orthopedic Surgery

## 2021-06-25 DIAGNOSIS — M25311 Other instability, right shoulder: Secondary | ICD-10-CM

## 2021-06-25 NOTE — Progress Notes (Signed)
   Post-Op Visit Note   Patient: Xavier Cochran.           Date of Birth: 24-Mar-1991           MRN: [id number] Visit Date: 06/25/2021 PCP: [name], MD   Assessment & Plan:  Chief Complaint:  Chief Complaint  Patient presents with   Right Shoulder - Routine Post Op   Visit Diagnoses:  1. Shoulder instability, right     Plan: Xavier Cochran is a 30 year old patient is 3 weeks out right shoulder arthroscopy and shoulder stabilization.  He has been doing well.  On exam he has good deltoid function.  I would like for him to start doing physical therapy for some below shoulder range of motion exercises along with deltoid isometrics and rotator cuff strengthening.  No external rotation greater than 30 degrees no forward flexion or abduction above 90 degrees.  In 3 weeks we will discontinue sling permanently and start going overhead with range of motion.  Right now shoulder feels very stable.  I think the stiffness is a plus which we can work out with therapy over the next 6 to 8 weeks.  Follow-Up Instructions: Return in about 3 weeks (around 07/16/2021).   Orders:  No orders of the defined types were placed in this encounter.  No orders of the defined types were placed in this encounter.   Imaging: No results found.  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   MCL sprain of right knee 08/15/2019   Acute prostatitis 03/06/2019   Hemorrhoid 03/06/2019   Acute shoulder bursitis, right 02/27/2019   Ganglion cyst of foot 02/27/2019   Asthma 04/28/2018   Constipation 07/08/2016   Right knee pain 05/29/2015   Shoulder instability, right 01/17/2015   Right shoulder pain 02/03/2014   Proteinuria 11/15/2013   Right inguinal hernia 09/27/2013   DOE (dyspnea on  exertion) 06/10/2013   Venereal disease, unspecified 11/02/2012   Allergic rhinitis    External hemorrhoid, thrombosed 10/24/2012   Osgood-Schlatter's disease 10/28/2011   Encounter for well adult exam with abnormal findings 10/22/2011   Past Medical History:  Diagnosis Date   Allergic rhinitis, cause unspecified     Family History  Problem Relation Age of Onset   Lupus Paternal Grandmother     Past Surgical History:  Procedure Laterality Date   NO PAST SURGERIES     SHOULDER ARTHROSCOPY WITH LABRAL REPAIR Right 06/01/2021   Procedure: RIGHT SHOULDER ARTHROSCOPY WITH LABRAL REPAIR INFERIOR;  Surgeon: Cammy Copa, MD;  Location: [facility] OR;  Service: Orthopedics;  Laterality: Right;   Social History   Occupational History   Occupation: Audiological scientist  Tobacco Use   Smoking status: Never   Smokeless tobacco: Never  Vaping Use   Vaping Use: Never used  Substance and Sexual Activity   Alcohol use: Yes    Comment: occasional   Drug use: No   Sexual activity: Not on file

## 2021-06-28 ENCOUNTER — Encounter: Payer: Self-pay | Admitting: Rehabilitative and Restorative Service Providers"

## 2021-06-28 ENCOUNTER — Ambulatory Visit (INDEPENDENT_AMBULATORY_CARE_PROVIDER_SITE_OTHER): Payer: 59 | Admitting: Rehabilitative and Restorative Service Providers"

## 2021-06-28 ENCOUNTER — Other Ambulatory Visit: Payer: Self-pay

## 2021-06-28 DIAGNOSIS — R600 Localized edema: Secondary | ICD-10-CM | POA: Diagnosis not present

## 2021-06-28 DIAGNOSIS — M25511 Pain in right shoulder: Secondary | ICD-10-CM | POA: Diagnosis not present

## 2021-06-28 DIAGNOSIS — R293 Abnormal posture: Secondary | ICD-10-CM

## 2021-06-28 DIAGNOSIS — M6281 Muscle weakness (generalized): Secondary | ICD-10-CM | POA: Diagnosis not present

## 2021-06-28 DIAGNOSIS — G8929 Other chronic pain: Secondary | ICD-10-CM

## 2021-06-28 NOTE — Therapy (Signed)
[facility] Physical Therapy [address] [place], [place], [address] Phone: [phone]   Fax:  [phone]  Physical Therapy Evaluation  Patient Details  Name: Xavier Cochran. MRN: [id number] Date of Birth: 05-10-1991 Referring Provider (PT): Dr. Cammy Copa   Encounter Date: 06/28/2021   PT End of Session - 06/28/21 1022     Visit Number 1    Number of Visits 12    Date for PT Re-Evaluation 09/06/21    Progress Note Due on Visit 10    PT Start Time 1022    PT Stop Time 1053    PT Time Calculation (min) 31 min    Activity Tolerance Patient tolerated treatment well    Behavior During Therapy WFL for tasks assessed/performed             Past Medical History:  Diagnosis Date   Allergic rhinitis, cause unspecified     Past Surgical History:  Procedure Laterality Date   NO PAST SURGERIES     SHOULDER ARTHROSCOPY WITH LABRAL REPAIR Right 06/01/2021   Procedure: RIGHT SHOULDER ARTHROSCOPY WITH LABRAL REPAIR INFERIOR;  Surgeon: Cammy Copa, MD;  Location: [facility] OR;  Service: Orthopedics;  Laterality: Right;    There were no vitals filed for this visit.    Subjective Assessment - 06/28/21 1024     Subjective Pt. indicated history of dislocations of Rt shoulder over several years per report.  Pt. indicated having some pain with movements in Rt shoulder.  Pt. stated he is able to sleep at this time.  Wearing sling upon arrival.    Pertinent History History of recurrent dislocations, previous therapy and ultimately current surgery.    Limitations House hold activities;Lifting    Patient Stated Goals Reduce pain    Currently in Pain? Yes    Pain Score 9    at worst   Pain Location Shoulder    Pain Orientation Right    Pain Descriptors / Indicators Sharp    Pain Type Surgical pain;Chronic pain    Pain Onset More than a month ago    Pain Frequency Intermittent    Aggravating Factors  stiff in morning, quick movements    Pain Relieving Factors  avoiding quick movements                [facility] PT Assessment - 06/28/21 0001       Assessment   Medical Diagnosis S/P Rt shoulder arthroscopy and stabilization    Referring Provider (PT) Dr. Cammy Copa    Onset Date/Surgical Date 06/01/21    Hand Dominance Right      Precautions   Precaution Comments ER <30 degrees, flexion/abduction < 90 degrees, nothing overhead      Balance Screen   Has the patient fallen in the past 6 months No    Has the patient had a decrease in activity level because of a fear of falling?  No    Is the patient reluctant to leave their home because of a fear of falling?  No      Home Environment   Living Environment Private residence    Additional Comments 3 sets of stairs for apartment      Prior Function   Vocation Requirements Work at [employer] (currently on sedentary work).  Does drive truck for pick up/drop off.  Recreational sports.      Observation/Other Assessments   Observations Localized edema Rt shoulder    Focus on Therapeutic Outcomes (FOTO)  intake 13%, predicted  62%      Sensation   Light Touch Appears Intact      Posture/Postural Control   Posture Comments Rounded shoulders      ROM / Strength   AROM / PROM / Strength Strength;PROM;AROM      AROM   Overall AROM Comments Lt shoulder WFL.  Unable to perform active movement in supine or sitting due to pain in Rt shoulder    AROM Assessment Site Shoulder    Right/Left Shoulder Left;Right      PROM   Overall PROM Comments measured in supine    PROM Assessment Site Shoulder    Right/Left Shoulder Right    Right Shoulder Flexion 60 Degrees    Right Shoulder ABduction 55 Degrees    Right Shoulder Internal Rotation --   to belly in supine 45 deg abduction   Right Shoulder External Rotation 0 Degrees   in supine 45 deg abduction     Strength   Overall Strength Comments Pain noted in all MMT Rt shoulder    Strength Assessment Site Shoulder;Elbow    Right/Left Shoulder  Right;Left    Right Shoulder Flexion 1/5    Right Shoulder ABduction 1/5    Right Shoulder Internal Rotation 3+/5    Right Shoulder External Rotation 1/5    Left Shoulder Flexion 5/5   17.7, 18.7 lbs   Left Shoulder ABduction 5/5   16.7, 18 lbs   Left Shoulder Internal Rotation 5/5    Left Shoulder External Rotation 5/5   26.9, 24.2 lbs   Right/Left Elbow Right;Left      Palpation   Palpation comment Tenderness anterior, lateral, posterior Rt glenohumeral joint                        Objective measurements completed on examination: See above findings.       [facility] Adult PT Treatment/Exercise - 06/28/21 0001       Exercises   Exercises Other Exercises    Other Exercises  HEP instruction/performance c cues for techniques, handout provided.  Trial set performed of each for comprehension and symptom assessment.  Consisting of supine passive flexion < 90, seated retraction hold 5 sec, seated supported pendulums c Lt arm movmeent of Rt all directions, AROM elbow flexion/extension      Manual Therapy   Manual therapy comments g2 inferior jt mobs, PROM Rt shoulder to tolerance                    PT Education - 06/28/21 1050     Education Details HEP, POC, protocol (< 90 deg flexion, abduction, < 30 degrees ER)    Person(s) Educated Patient    Methods Explanation;Demonstration;Verbal cues;Handout    Comprehension Returned demonstration;Verbalized understanding              PT Short Term Goals - 06/28/21 1021       PT SHORT TERM GOAL #1   Title Patient will demonstrate independent use of home exercise program to maintain progress from in clinic treatments.    Time 3    Period Weeks    Status New    Target Date 09/06/21               PT Long Term Goals - 06/28/21 1021       PT LONG TERM GOAL #1   Title Patient will demonstrate/report pain at worst less than or equal to 2/10 to facilitate minimal limitation in daily  activity secondary to  pain symptoms.    Time 10    Period Weeks    Status New    Target Date 09/06/21      PT LONG TERM GOAL #2   Title Patient will demonstrate independent use of home exercise program to facilitate ability to maintain/progress functional gains from skilled physical therapy services.    Time 10    Period Weeks    Status New    Target Date 09/06/21      PT LONG TERM GOAL #3   Title Pt. will demonstrate FOTO outcome > or = 62% to indicated reduced disability due to condition.    Time 10    Period Weeks    Status New    Target Date 09/06/21      PT LONG TERM GOAL #4   Title Patient will demonstrate Rt GH joint mobility WFL to facilitate usual self care, dressing, reaching overhead at PLOF s limitation due to symptoms.    Time 10    Period Weeks    Status New    Target Date 09/06/21      PT LONG TERM GOAL #5   Title Patient will demonstrate Rt UE MMT 5/5 throughout to facilitate usual lifting, carrying in functional activity to PLOF s limitation.    Time 10    Period Weeks    Status New    Target Date 09/06/21                    Plan - 06/28/21 1051     Clinical Impression Statement Patient is a 30 y.o. who comes to clinic with complaints of Rt shoulder pain s/p recent surgery with mobility, strength and movement coordination deficits that impair their ability to perform usual daily and recreational functional activities without increase difficulty/symptoms at this time.  Patient to benefit from skilled PT services to address impairments and limitations to improve to previous level of function without restriction secondary to condition.    Examination-Activity Limitations Carry;Lift;Reach Overhead    Examination-Participation Restrictions Occupation;Laundry;Community Activity;Driving;Cleaning    Stability/Clinical Decision Making Stable/Uncomplicated    Clinical Decision Making Low    Rehab Potential Good    PT Frequency --   2x/week for first few weeks then 1x/week if  appropriate   PT Duration Other (comment)   10 weeks   PT Treatment/Interventions ADLs/Self Care Home Management;Cryotherapy;Electrical Stimulation;Iontophoresis 4mg /ml Dexamethasone;Moist Heat;Balance training;Therapeutic exercise;Therapeutic activities;Functional mobility training;Stair training;Gait training;DME Instruction;Ultrasound;Neuromuscular re-education;Patient/family education;Passive range of motion;Spinal Manipulations;Joint Manipulations;Dry needling;Taping;Vasopneumatic Device;Manual techniques    PT Next Visit Plan Progress ROM to protocol limits, introduce isometrics    PT Home Exercise Plan [email or web address]    Consulted and Agree with Plan of Care Patient             Patient will benefit from skilled therapeutic intervention in order to improve the following deficits and impairments:  Hypomobility, Decreased endurance, Increased edema, Decreased activity tolerance, Decreased strength, Impaired UE functional use, Pain, Decreased mobility, Decreased range of motion, Impaired perceived functional ability, Improper body mechanics, Postural dysfunction, Impaired flexibility, Decreased coordination  Visit Diagnosis: Chronic right shoulder pain - Plan: PT plan of care cert/re-cert  Muscle weakness (generalized) - Plan: PT plan of care cert/re-cert  Localized edema - Plan: PT plan of care cert/re-cert  Abnormal posture - Plan: PT plan of care cert/re-cert     Problem List Patient Active Problem List   Diagnosis Date Noted   Prolapsed internal hemorrhoids, grade 2 04/12/2021   URI (  upper respiratory infection) 02/24/2021   B12 deficiency 10/31/2020   Paresthesia of left arm 10/30/2020   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   MCL sprain of right knee 08/15/2019   Acute prostatitis 03/06/2019   Hemorrhoid 03/06/2019   Acute shoulder bursitis, right 02/27/2019   Ganglion cyst of foot 02/27/2019   Asthma 04/28/2018   Constipation 07/08/2016   Right knee pain 05/29/2015    Shoulder instability, right 01/17/2015   Right shoulder pain 02/03/2014   Proteinuria 11/15/2013   Right inguinal hernia 09/27/2013   DOE (dyspnea on exertion) 06/10/2013   Venereal disease, unspecified 11/02/2012   Allergic rhinitis    External hemorrhoid, thrombosed 10/24/2012   Osgood-Schlatter's disease 10/28/2011   Encounter for well adult exam with abnormal findings 10/22/2011    Chyrel Masson, PT, DPT, OCS, ATC 06/28/21  10:58 AM    [facility] Physical Therapy [address] [place], [place], [address] Phone: [phone]   Fax:  [phone]  Name: Xavier Cochran. MRN: [id number] Date of Birth: 03-Feb-1991

## 2021-06-28 NOTE — Patient Instructions (Signed)
Access Code: [email or web address] URL: [email or web address] Date: 06/28/2021 Prepared by: Chyrel Masson  Exercises Supine Shoulder Flexion PROM (Mirrored) - 2-3 x daily - 7 x weekly - 1-2 sets - 10 reps - 5 hold Seated Scapular Retraction - 2-3 x daily - 7 x weekly - 2 sets - 10 reps - 5 hold Seated Elbow Flexion and Extension AROM - 2-3 x daily - 7 x weekly - 3 sets - 10 reps Seated pendulums all directions

## 2021-07-05 ENCOUNTER — Other Ambulatory Visit: Payer: Self-pay

## 2021-07-05 ENCOUNTER — Encounter: Payer: Self-pay | Admitting: Rehabilitative and Restorative Service Providers"

## 2021-07-05 ENCOUNTER — Ambulatory Visit (INDEPENDENT_AMBULATORY_CARE_PROVIDER_SITE_OTHER): Payer: 59 | Admitting: Rehabilitative and Restorative Service Providers"

## 2021-07-05 DIAGNOSIS — R600 Localized edema: Secondary | ICD-10-CM

## 2021-07-05 DIAGNOSIS — R293 Abnormal posture: Secondary | ICD-10-CM | POA: Diagnosis not present

## 2021-07-05 DIAGNOSIS — M25511 Pain in right shoulder: Secondary | ICD-10-CM

## 2021-07-05 DIAGNOSIS — M6281 Muscle weakness (generalized): Secondary | ICD-10-CM | POA: Diagnosis not present

## 2021-07-05 DIAGNOSIS — G8929 Other chronic pain: Secondary | ICD-10-CM

## 2021-07-05 NOTE — Therapy (Signed)
[facility] Physical Therapy [address] [place], [place], [address] Phone: [phone]   Fax:  [phone]  Physical Therapy Treatment  Patient Details  Name: Xavier Cochran. MRN: [id number] Date of Birth: 1991-02-24 Referring Provider (PT): Dr. Cammy Copa   Encounter Date: 07/05/2021   PT End of Session - 07/05/21 0803     Visit Number 2    Number of Visits 12    Date for PT Re-Evaluation 09/06/21    Progress Note Due on Visit 10    PT Start Time 0759    PT Stop Time 0838    PT Time Calculation (min) 39 min    Activity Tolerance Patient tolerated treatment well    Behavior During Therapy [facility] for tasks assessed/performed             Past Medical History:  Diagnosis Date   Allergic rhinitis, cause unspecified     Past Surgical History:  Procedure Laterality Date   NO PAST SURGERIES     SHOULDER ARTHROSCOPY WITH LABRAL REPAIR Right 06/01/2021   Procedure: RIGHT SHOULDER ARTHROSCOPY WITH LABRAL REPAIR INFERIOR;  Surgeon: Cammy Copa, MD;  Location: [facility] OR;  Service: Orthopedics;  Laterality: Right;    There were no vitals filed for this visit.   Subjective Assessment - 07/05/21 0804     Subjective Pt. stated primarily stiffness upon arrival today, no pain at rest.    Pertinent History History of recurrent dislocations, previous therapy and ultimately current surgery.    Limitations House hold activities;Lifting    Patient Stated Goals Reduce pain    Currently in Pain? No/denies    Pain Score 0-No pain                [facility] PT Assessment - 07/05/21 0001       Assessment   Medical Diagnosis S/P Rt shoulder arthroscopy and stabilization    Referring Provider (PT) Dr. Cammy Copa    Onset Date/Surgical Date 06/01/21    Hand Dominance Right      PROM   Overall PROM Comments measured in supine    Right Shoulder Flexion 90 Degrees    Right Shoulder ABduction 90 Degrees    Right Shoulder External Rotation 15 Degrees    in 45 deg abduction in supine                          [facility] Adult PT Treatment/Exercise - 07/05/21 0001       Exercises   Exercises Shoulder      Shoulder Exercises: Supine   Protraction AAROM;Both   2 x 10 1 lb bar   Flexion AAROM;Both   1 lb bar 2 x 10 2-3 second hold < 90 degrees   Other Supine Exercises supine wand abduction x 10 Rt c 1 lb bar      Shoulder Exercises: Isometric Strengthening   Other Isometric Exercises 5 sec on/off x 10 submax/painfree in flexion, abd, er, ir on Rt arm c arm at side      Manual Therapy   Manual therapy comments g2 inferior joint mobs in flexion, scaption and abduction.  Sustained posterior glide c passive ER mobility                    PT Education - 07/05/21 0827     Education Details New HEP additions    Person(s) Educated Patient    Methods Explanation;Demonstration;Verbal cues;Handout    Comprehension  Returned demonstration;Verbalized understanding              PT Short Term Goals - 07/05/21 0821       PT SHORT TERM GOAL #1   Title Patient will demonstrate independent use of home exercise program to maintain progress from in clinic treatments.    Time 3    Period Weeks    Status On-going    Target Date 07/19/21   corrected from eval date              PT Long Term Goals - 06/28/21 1021       PT LONG TERM GOAL #1   Title Patient will demonstrate/report pain at worst less than or equal to 2/10 to facilitate minimal limitation in daily activity secondary to pain symptoms.    Time 10    Period Weeks    Status New    Target Date 09/06/21      PT LONG TERM GOAL #2   Title Patient will demonstrate independent use of home exercise program to facilitate ability to maintain/progress functional gains from skilled physical therapy services.    Time 10    Period Weeks    Status New    Target Date 09/06/21      PT LONG TERM GOAL #3   Title Pt. will demonstrate FOTO outcome > or = 62% to  indicated reduced disability due to condition.    Time 10    Period Weeks    Status New    Target Date 09/06/21      PT LONG TERM GOAL #4   Title Patient will demonstrate Rt GH joint mobility WFL to facilitate usual self care, dressing, reaching overhead at PLOF s limitation due to symptoms.    Time 10    Period Weeks    Status New    Target Date 09/06/21      PT LONG TERM GOAL #5   Title Patient will demonstrate Rt UE MMT 5/5 throughout to facilitate usual lifting, carrying in functional activity to PLOF s limitation.    Time 10    Period Weeks    Status New    Target Date 09/06/21                   Plan - 07/05/21 8546     Clinical Impression Statement Pt. demonstrated improvement in available passive range to appropriate protocol limitated movement (shoulder height).  Guarding noted throughout movements and reduced symptoms noted c guarding was reduced.  Continued progression to gain range and strengthening as tolerated indicated at this time.    Examination-Activity Limitations Carry;Lift;Reach Overhead    Examination-Participation Restrictions Occupation;Laundry;Community Activity;Driving;Cleaning    Stability/Clinical Decision Making Stable/Uncomplicated    Rehab Potential Good    PT Frequency --   2x/week for first few weeks then 1x/week if appropriate   PT Duration Other (comment)   10 weeks   PT Treatment/Interventions ADLs/Self Care Home Management;Cryotherapy;Electrical Stimulation;Iontophoresis 4mg /ml Dexamethasone;Moist Heat;Balance training;Therapeutic exercise;Therapeutic activities;Functional mobility training;Stair training;Gait training;DME Instruction;Ultrasound;Neuromuscular re-education;Patient/family education;Passive range of motion;Spinal Manipulations;Joint Manipulations;Dry needling;Taping;Vasopneumatic Device;Manual techniques    PT Next Visit Plan Improve AAROM/AROM to shoulder hieght limits, < 30 deg ER    PT Home Exercise Plan [email or web address]     Consulted and Agree with Plan of Care Patient             Patient will benefit from skilled therapeutic intervention in order to improve the following deficits and impairments:  Hypomobility, Decreased endurance, Increased edema,  Decreased activity tolerance, Decreased strength, Impaired UE functional use, Pain, Decreased mobility, Decreased range of motion, Impaired perceived functional ability, Improper body mechanics, Postural dysfunction, Impaired flexibility, Decreased coordination  Visit Diagnosis: Chronic right shoulder pain  Muscle weakness (generalized)  Localized edema  Abnormal posture     Problem List Patient Active Problem List   Diagnosis Date Noted   Prolapsed internal hemorrhoids, grade 2 04/12/2021   URI (upper respiratory infection) 02/24/2021   B12 deficiency 10/31/2020   Paresthesia of left arm 10/30/2020   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   MCL sprain of right knee 08/15/2019   Acute prostatitis 03/06/2019   Hemorrhoid 03/06/2019   Acute shoulder bursitis, right 02/27/2019   Ganglion cyst of foot 02/27/2019   Asthma 04/28/2018   Constipation 07/08/2016   Right knee pain 05/29/2015   Shoulder instability, right 01/17/2015   Right shoulder pain 02/03/2014   Proteinuria 11/15/2013   Right inguinal hernia 09/27/2013   DOE (dyspnea on exertion) 06/10/2013   Venereal disease, unspecified 11/02/2012   Allergic rhinitis    External hemorrhoid, thrombosed 10/24/2012   Osgood-Schlatter's disease 10/28/2011   Encounter for well adult exam with abnormal findings 10/22/2011    Chyrel Masson, PT, DPT, OCS, ATC 07/05/21  8:32 AM    [facility] Physical Therapy [address] [place], [place], [address] Phone: [phone]   Fax:  [phone]  Name: Trayvion Embleton. MRN: [id number] Date of Birth: 1991-11-15

## 2021-07-05 NOTE — Patient Instructions (Signed)
Access Code: [email or web address] URL: [email or web address] Date: 07/05/2021 Prepared by: Chyrel Masson  Exercises Seated Scapular Retraction - 2-3 x daily - 7 x weekly - 2 sets - 10 reps - 5 hold Seated Elbow Flexion and Extension AROM - 2-3 x daily - 7 x weekly - 3 sets - 10 reps Supine Shoulder Flexion Extension AAROM with Dowel - 2 x daily - 7 x weekly - 2 sets - 10 reps - 5 hold Supine Shoulder Protraction with Dowel - 2 x daily - 7 x weekly - 2 sets - 10 reps - 2-3 hold Supine Shoulder Abduction AAROM with Dowel - 2 x daily - 7 x weekly - 2 sets - 10 reps - 5 hold Isometric Shoulder Flexion at Wall - 2 x daily - 7 x weekly - 1 sets - 15 reps - 5 hold Standing Isometric Shoulder Abduction with Doorway - Arm Bent - 2 x daily - 7 x weekly - 1 sets - 15 reps - 5 hold Standing Isometric Shoulder External Rotation with Doorway - 2 x daily - 7 x weekly - 1 sets - 10 reps - 5 hold Standing Isometric Shoulder Internal Rotation at Doorway (Mirrored) - 2 x daily - 7 x weekly - 1 sets - 15 reps - 5 hold

## 2021-07-07 ENCOUNTER — Other Ambulatory Visit: Payer: Self-pay

## 2021-07-07 ENCOUNTER — Encounter: Payer: Self-pay | Admitting: Rehabilitative and Restorative Service Providers"

## 2021-07-07 ENCOUNTER — Ambulatory Visit (INDEPENDENT_AMBULATORY_CARE_PROVIDER_SITE_OTHER): Payer: 59 | Admitting: Rehabilitative and Restorative Service Providers"

## 2021-07-07 DIAGNOSIS — M6281 Muscle weakness (generalized): Secondary | ICD-10-CM

## 2021-07-07 DIAGNOSIS — M25511 Pain in right shoulder: Secondary | ICD-10-CM

## 2021-07-07 DIAGNOSIS — G8929 Other chronic pain: Secondary | ICD-10-CM

## 2021-07-07 DIAGNOSIS — R293 Abnormal posture: Secondary | ICD-10-CM

## 2021-07-07 DIAGNOSIS — R600 Localized edema: Secondary | ICD-10-CM | POA: Diagnosis not present

## 2021-07-07 NOTE — Therapy (Signed)
[facility] [address] [place], [place], [address] Phone: [phone]   Fax:  [phone]  Physical Therapy Treatment  Patient Details  Name: Xavier Cochran. MRN: [id number] Date of Birth: September 05, 1991 Referring Provider (PT): Dr. Cammy Copa   Encounter Date: 07/07/2021   PT End of Session - 07/07/21 1013     Visit Number 3    Number of Visits 12    Date for PT Re-Evaluation 09/06/21    Progress Note Due on Visit 10    PT Start Time 1013    PT Stop Time 1052    PT Time Calculation (min) 39 min    Activity Tolerance Patient tolerated treatment well    Behavior During Therapy WFL for tasks assessed/performed             Past Medical History:  Diagnosis Date   Allergic rhinitis, cause unspecified    Hemorrhoids     Past Surgical History:  Procedure Laterality Date   SHOULDER ARTHROSCOPY WITH LABRAL REPAIR Right 06/01/2021   Procedure: RIGHT SHOULDER ARTHROSCOPY WITH LABRAL REPAIR INFERIOR;  Surgeon: Cammy Copa, MD;  Location: [facility] OR;  Service: Orthopedics;  Laterality: Right;    There were no vitals filed for this visit.   Subjective Assessment - 07/07/21 1016     Subjective Pt. indicated no pain at rest and felt ok after last visit.  Still tight.    Pertinent History History of recurrent dislocations, previous therapy and ultimately current surgery.    Limitations House hold activities;Lifting    Patient Stated Goals Reduce pain    Currently in Pain? No/denies                               [facility] Adult PT Treatment/Exercise - 07/07/21 0001       Shoulder Exercises: Supine   Flexion AAROM;Both   1 lb bar 3x 10, AROM  : 3 x 10.  all below 90 degrees     Shoulder Exercises: Sidelying   External Rotation Right   belly to 0 degrees c towel at side 3x 10   ABduction Right;15 reps   below 90 degrees     Shoulder Exercises: ROM/Strengthening   UBE (Upper Arm Bike) Lvl 1 3 mins fwd/back each  way      Manual Therapy   Manual therapy comments g2-3 inferior joint mobs in flexion, scaption and abduction.  Sustained posterior glide c passive ER mobility                      PT Short Term Goals - 07/05/21 8329       PT SHORT TERM GOAL #1   Title Patient will demonstrate independent use of home exercise program to maintain progress from in clinic treatments.    Time 3    Period Weeks    Status On-going    Target Date 07/19/21   corrected from eval date              PT Long Term Goals - 06/28/21 1021       PT LONG TERM GOAL #1   Title Patient will demonstrate/report pain at worst less than or equal to 2/10 to facilitate minimal limitation in daily activity secondary to pain symptoms.    Time 10    Period Weeks    Status New    Target Date 09/06/21      PT  LONG TERM GOAL #2   Title Patient will demonstrate independent use of home exercise program to facilitate ability to maintain/progress functional gains from skilled physical therapy services.    Time 10    Period Weeks    Status New    Target Date 09/06/21      PT LONG TERM GOAL #3   Title Pt. will demonstrate FOTO outcome > or = 62% to indicated reduced disability due to condition.    Time 10    Period Weeks    Status New    Target Date 09/06/21      PT LONG TERM GOAL #4   Title Patient will demonstrate Rt GH joint mobility WFL to facilitate usual self care, dressing, reaching overhead at PLOF s limitation due to symptoms.    Time 10    Period Weeks    Status New    Target Date 09/06/21      PT LONG TERM GOAL #5   Title Patient will demonstrate Rt UE MMT 5/5 throughout to facilitate usual lifting, carrying in functional activity to PLOF s limitation.    Time 10    Period Weeks    Status New    Target Date 09/06/21                   Plan - 07/07/21 1035     Clinical Impression Statement Flexion and abduction to 90 degrees c improved quality in passive mobility.  AAROM to 90  degrees in supine flexion as well.  ER limitation restriction still noted below 30 degree protocal allowed movement but gaining.    Examination-Activity Limitations Carry;Lift;Reach Overhead    Examination-Participation Restrictions Occupation;Laundry;Community Activity;Driving;Cleaning    Stability/Clinical Decision Making Stable/Uncomplicated    Rehab Potential Good    PT Frequency --   2x/week for first few weeks then 1x/week if appropriate   PT Duration Other (comment)   10 weeks   PT Treatment/Interventions ADLs/Self Care Home Management;Cryotherapy;Electrical Stimulation;Iontophoresis 4mg /ml Dexamethasone;Moist Heat;Balance training;Therapeutic exercise;Therapeutic activities;Functional mobility training;Stair training;Gait training;DME Instruction;Ultrasound;Neuromuscular re-education;Patient/family education;Passive range of motion;Spinal Manipulations;Joint Manipulations;Dry needling;Taping;Vasopneumatic Device;Manual techniques    PT Next Visit Plan Improve AAROM/AROM to shoulder hieght limits, < 30 deg ER.  Measurements for MD visit    PT Home Exercise Plan [id number]    Consulted and Agree with Plan of Care Patient             Patient will benefit from skilled therapeutic intervention in order to improve the following deficits and impairments:  Hypomobility, Decreased endurance, Increased edema, Decreased activity tolerance, Decreased strength, Impaired UE functional use, Pain, Decreased mobility, Decreased range of motion, Impaired perceived functional ability, Improper body mechanics, Postural dysfunction, Impaired flexibility, Decreased coordination  Visit Diagnosis: Chronic right shoulder pain  Muscle weakness (generalized)  Localized edema  Abnormal posture     Problem List Patient Active Problem List   Diagnosis Date Noted   Prolapsed internal hemorrhoids, grade 2 04/12/2021   URI (upper respiratory infection) 02/24/2021   B12 deficiency 10/31/2020   Paresthesia  of left arm 10/30/2020   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   MCL sprain of right knee 08/15/2019   Acute prostatitis 03/06/2019   Hemorrhoid 03/06/2019   Acute shoulder bursitis, right 02/27/2019   Ganglion cyst of foot 02/27/2019   Asthma 04/28/2018   Constipation 07/08/2016   Right knee pain 05/29/2015   Shoulder instability, right 01/17/2015   Right shoulder pain 02/03/2014   Proteinuria 11/15/2013   Right inguinal hernia 09/27/2013  DOE (dyspnea on exertion) 06/10/2013   Venereal disease, unspecified 11/02/2012   Allergic rhinitis    External hemorrhoid, thrombosed 10/24/2012   Osgood-Schlatter's disease 10/28/2011   Encounter for well adult exam with abnormal findings 10/22/2011    Chyrel Masson, PT, DPT, OCS, ATC 07/07/21  10:50 AM    [facility] Physical Therapy [address] [place], [place], [address] Phone: [phone]   Fax:  [phone]  Name: Blayne Frankie. MRN: [id number] Date of Birth: 09-24-1991

## 2021-07-12 ENCOUNTER — Encounter: Payer: 59 | Admitting: Rehabilitative and Restorative Service Providers"

## 2021-07-16 ENCOUNTER — Encounter: Payer: Self-pay | Admitting: Orthopedic Surgery

## 2021-07-16 ENCOUNTER — Ambulatory Visit (INDEPENDENT_AMBULATORY_CARE_PROVIDER_SITE_OTHER): Payer: 59 | Admitting: Orthopedic Surgery

## 2021-07-16 ENCOUNTER — Other Ambulatory Visit: Payer: Self-pay

## 2021-07-16 DIAGNOSIS — M25311 Other instability, right shoulder: Secondary | ICD-10-CM

## 2021-07-16 NOTE — Progress Notes (Signed)
   Post-Op Visit Note   Patient: Xavier Cochran.           Date of Birth: December 10, 1990           MRN: [id number] Visit Date: 07/16/2021 PCP: [name], MD   Assessment & Plan:  Chief Complaint: No chief complaint on file.  Visit Diagnoses:  1. Shoulder instability, right     Plan: Patient is a 30 year old male who presents s/p right shoulder arthroscopy with labral repair on 06/01/2021.  Reports that he is doing well.  Occasional soreness but nothing he has to take any medication for.  Denies any fevers chills.  Does not wake with pain.  No instability episodes.  He is in physical therapy upstairs working on range of motion primarily.  He has not been going overhead at all.  On exam he has well-healed incisions with deltoid firing.  15 degrees external rotation, 70 degrees abduction, 100 degrees forward flexion.  Plan for him to continue with physical therapy and okay to start going overhead.  Still no lifting.  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  Follow-up in 6 weeks for clinical recheck with Dr. August Saucer.  Follow-Up Instructions: No follow-ups on file.   Orders:  No orders of the defined types were placed in this encounter.  No orders of the defined types were placed in this encounter.   Imaging: No results found.  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   MCL sprain of right knee 08/15/2019   Acute prostatitis 03/06/2019   Hemorrhoid 03/06/2019   Acute shoulder bursitis, right 02/27/2019   Ganglion cyst of foot 02/27/2019   Asthma 04/28/2018   Constipation 07/08/2016   Right knee pain 05/29/2015   Shoulder instability, right 01/17/2015   Right shoulder pain 02/03/2014   Proteinuria 11/15/2013   Right inguinal hernia 09/27/2013    DOE (dyspnea on exertion) 06/10/2013   Venereal disease, unspecified 11/02/2012   Allergic rhinitis    External hemorrhoid, thrombosed 10/24/2012   Osgood-Schlatter's disease 10/28/2011   Encounter for well adult exam with abnormal findings 10/22/2011   Past Medical History:  Diagnosis Date   Allergic rhinitis, cause unspecified    Hemorrhoids     Family History  Problem Relation Age of Onset   Lupus Paternal Grandmother     Past Surgical History:  Procedure Laterality Date   SHOULDER ARTHROSCOPY WITH LABRAL REPAIR Right 06/01/2021   Procedure: RIGHT SHOULDER ARTHROSCOPY WITH LABRAL REPAIR INFERIOR;  Surgeon: Cammy Copa, MD;  Location: [facility] OR;  Service: Orthopedics;  Laterality: Right;   Social History   Occupational History   Occupation: Audiological scientist  Tobacco Use   Smoking status: Never   Smokeless tobacco: Never  Vaping Use   Vaping Use: Never used  Substance and Sexual Activity   Alcohol use: Yes    Comment: occasional   Drug use: No   Sexual activity: Not on file

## 2021-07-19 ENCOUNTER — Other Ambulatory Visit: Payer: Self-pay

## 2021-07-19 ENCOUNTER — Ambulatory Visit (INDEPENDENT_AMBULATORY_CARE_PROVIDER_SITE_OTHER): Payer: 59 | Admitting: Physical Therapy

## 2021-07-19 ENCOUNTER — Encounter: Payer: Self-pay | Admitting: Physical Therapy

## 2021-07-19 DIAGNOSIS — G8929 Other chronic pain: Secondary | ICD-10-CM

## 2021-07-19 DIAGNOSIS — R600 Localized edema: Secondary | ICD-10-CM

## 2021-07-19 DIAGNOSIS — R293 Abnormal posture: Secondary | ICD-10-CM | POA: Diagnosis not present

## 2021-07-19 DIAGNOSIS — M6281 Muscle weakness (generalized): Secondary | ICD-10-CM | POA: Diagnosis not present

## 2021-07-19 DIAGNOSIS — M25511 Pain in right shoulder: Secondary | ICD-10-CM | POA: Diagnosis not present

## 2021-07-19 NOTE — Therapy (Signed)
[facility] [address] [place], [place], [address] Phone: [phone]   Fax:  [phone]  Physical Therapy Treatment  Patient Details  Name: Xavier Cochran. MRN: [id number] Date of Birth: [birth year] Referring Provider (PT): Dr. Cammy Copa   Encounter Date: 07/19/2021   PT End of Session - 07/19/21 1226     Visit Number 4    Number of Visits 12    Date for PT Re-Evaluation 09/06/21    Progress Note Due on Visit 10    PT Start Time 1130    PT Stop Time 1210    PT Time Calculation (min) 40 min    Activity Tolerance Patient tolerated treatment well    Behavior During Therapy [facility] for tasks assessed/performed             Past Medical History:  Diagnosis Date   Allergic rhinitis, cause unspecified    Hemorrhoids     Past Surgical History:  Procedure Laterality Date   SHOULDER ARTHROSCOPY WITH LABRAL REPAIR Right 06/01/2021   Procedure: RIGHT SHOULDER ARTHROSCOPY WITH LABRAL REPAIR INFERIOR;  Surgeon: Cammy Copa, MD;  Location: [facility] OR;  Service: Orthopedics;  Laterality: Right;    There were no vitals filed for this visit.   Subjective Assessment - 07/19/21 1131     Subjective okay to begin overhead activity - has tried some but still limited    Pertinent History History of recurrent dislocations, previous therapy and ultimately current surgery.    Limitations House hold activities;Lifting    Patient Stated Goals Reduce pain    Currently in Pain? No/denies   sore in AM, resolves within first few min of getting up               [facility] PT Assessment - 07/19/21 1138       Assessment   Medical Diagnosis S/P Rt shoulder arthroscopy and stabilization    Referring Provider (PT) Dr. Cammy Copa    Onset Date/Surgical Date 06/01/21    Hand Dominance Right    Next MD Visit 08/25/21      Precautions   Precaution Comments ER <30 degrees, flexion/abduction now allowed overhead      AROM   Left Shoulder  Flexion 125 Degrees                           [facility] Adult PT Treatment/Exercise - 07/19/21 1131       Shoulder Exercises: Supine   Flexion AAROM;Both   1 lb bar 3x 10, AROM  : 3 x 10.  motion to tolerance     Shoulder Exercises: Sidelying   External Rotation Right   belly to 0 degrees c towel at side 3x 10   ABduction Right   3x10     Shoulder Exercises: Standing   Other Standing Exercises wall ladder flexion and scaption to tolerance x 10 reps      Shoulder Exercises: Pulleys   Flexion 3 minutes    Flexion Limitations 5 sec hold    Scaption 3 minutes    Scaption Limitations 5 sec      Shoulder Exercises: ROM/Strengthening   UBE (Upper Arm Bike) L2 x 8 min; alt every 2'      Manual Therapy   Manual therapy comments Rt shoulder all motions to tolerance within protocol limitations; g2-3 inf and A/P mobs  PT Short Term Goals - 07/19/21 1226       PT SHORT TERM GOAL #1   Title Patient will demonstrate independent use of home exercise program to maintain progress from in clinic treatments.    Time 3    Period Weeks    Status Achieved    Target Date 07/19/21   corrected from eval date              PT Long Term Goals - 06/28/21 1021       PT LONG TERM GOAL #1   Title Patient will demonstrate/report pain at worst less than or equal to 2/10 to facilitate minimal limitation in daily activity secondary to pain symptoms.    Time 10    Period Weeks    Status New    Target Date 09/06/21      PT LONG TERM GOAL #2   Title Patient will demonstrate independent use of home exercise program to facilitate ability to maintain/progress functional gains from skilled physical therapy services.    Time 10    Period Weeks    Status New    Target Date 09/06/21      PT LONG TERM GOAL #3   Title Pt. will demonstrate FOTO outcome > or = 62% to indicated reduced disability due to condition.    Time 10    Period Weeks    Status New     Target Date 09/06/21      PT LONG TERM GOAL #4   Title Patient will demonstrate Rt GH joint mobility WFL to facilitate usual self care, dressing, reaching overhead at PLOF s limitation due to symptoms.    Time 10    Period Weeks    Status New    Target Date 09/06/21      PT LONG TERM GOAL #5   Title Patient will demonstrate Rt UE MMT 5/5 throughout to facilitate usual lifting, carrying in functional activity to PLOF s limitation.    Time 10    Period Weeks    Status New    Target Date 09/06/21                   Plan - 07/19/21 1226     Clinical Impression Statement Pt tolerated session well today, and able to progress ROM overhead today.  Continued focus on maximizing ROM at this time, and no lifting still in place.  Will continue to benefit from PT to maximize function.    Examination-Activity Limitations Carry;Lift;Reach Overhead    Examination-Participation Restrictions Occupation;Laundry;Community Activity;Driving;Cleaning    Stability/Clinical Decision Making Stable/Uncomplicated    Rehab Potential Good    PT Frequency --   2x/week for first few weeks then 1x/week if appropriate   PT Duration Other (comment)   10 weeks   PT Treatment/Interventions ADLs/Self Care Home Management;Cryotherapy;Electrical Stimulation;Iontophoresis 4mg /ml Dexamethasone;Moist Heat;Balance training;Therapeutic exercise;Therapeutic activities;Functional mobility training;Stair training;Gait training;DME Instruction;Ultrasound;Neuromuscular re-education;Patient/family education;Passive range of motion;Spinal Manipulations;Joint Manipulations;Dry needling;Taping;Vasopneumatic Device;Manual techniques    PT Next Visit Plan Improve AAROM/AROM with overhead allowed, no update on ER restriction so keep < 30 deg for now, ?BFR    PT Home Exercise Plan [id number]    Consulted and Agree with Plan of Care Patient             Patient will benefit from skilled therapeutic intervention in order to  improve the following deficits and impairments:  Hypomobility, Decreased endurance, Increased edema, Decreased activity tolerance, Decreased strength, Impaired UE functional use, Pain, Decreased mobility,  Decreased range of motion, Impaired perceived functional ability, Improper body mechanics, Postural dysfunction, Impaired flexibility, Decreased coordination  Visit Diagnosis: Chronic right shoulder pain  Muscle weakness (generalized)  Localized edema  Abnormal posture     Problem List Patient Active Problem List   Diagnosis Date Noted   Prolapsed internal hemorrhoids, grade 2 04/12/2021   URI (upper respiratory infection) 02/24/2021   B12 deficiency 10/31/2020   Paresthesia of left arm 10/30/2020   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   MCL sprain of right knee 08/15/2019   Acute prostatitis 03/06/2019   Hemorrhoid 03/06/2019   Acute shoulder bursitis, right 02/27/2019   Ganglion cyst of foot 02/27/2019   Asthma 04/28/2018   Constipation 07/08/2016   Right knee pain 05/29/2015   Shoulder instability, right 01/17/2015   Right shoulder pain 02/03/2014   Proteinuria 11/15/2013   Right inguinal hernia 09/27/2013   DOE (dyspnea on exertion) 06/10/2013   Venereal disease, unspecified 11/02/2012   Allergic rhinitis    External hemorrhoid, thrombosed 10/24/2012   Osgood-Schlatter's disease 10/28/2011   Encounter for well adult exam with abnormal findings 10/22/2011        Clarita Crane, PT, DPT 07/19/21 12:32 PM     [facility] Physical Therapy [address] [place], [place], [address] Phone: [phone]   Fax:  [phone]  Name: Alistair Senft. MRN: [id number] Date of Birth: 09-24-1991

## 2021-07-20 ENCOUNTER — Ambulatory Visit: Payer: 59 | Admitting: Internal Medicine

## 2021-07-26 ENCOUNTER — Other Ambulatory Visit: Payer: Self-pay

## 2021-07-26 ENCOUNTER — Ambulatory Visit (INDEPENDENT_AMBULATORY_CARE_PROVIDER_SITE_OTHER): Payer: 59 | Admitting: Physical Therapy

## 2021-07-26 DIAGNOSIS — M25511 Pain in right shoulder: Secondary | ICD-10-CM | POA: Diagnosis not present

## 2021-07-26 DIAGNOSIS — M6281 Muscle weakness (generalized): Secondary | ICD-10-CM

## 2021-07-26 DIAGNOSIS — R600 Localized edema: Secondary | ICD-10-CM | POA: Diagnosis not present

## 2021-07-26 DIAGNOSIS — R293 Abnormal posture: Secondary | ICD-10-CM | POA: Diagnosis not present

## 2021-07-26 DIAGNOSIS — G8929 Other chronic pain: Secondary | ICD-10-CM

## 2021-07-26 NOTE — Therapy (Signed)
[facility] Physical Therapy [address] [place], [place], [address] Phone: [phone]   Fax:  [phone]  Physical Therapy Treatment  Patient Details  Name: Xavier Cochran. MRN: [id number] Date of Birth: [birth year] Referring Provider (PT): Dr. Cammy Copa   Encounter Date: 07/26/2021   PT End of Session - 07/26/21 1235     Visit Number 5    Number of Visits 12    Date for PT Re-Evaluation 09/06/21    Progress Note Due on Visit 10    PT Start Time 1145    PT Stop Time 1230    PT Time Calculation (min) 45 min    Activity Tolerance Patient tolerated treatment well    Behavior During Therapy [facility] for tasks assessed/performed             Past Medical History:  Diagnosis Date   Allergic rhinitis, cause unspecified    Hemorrhoids     Past Surgical History:  Procedure Laterality Date   SHOULDER ARTHROSCOPY WITH LABRAL REPAIR Right 06/01/2021   Procedure: RIGHT SHOULDER ARTHROSCOPY WITH LABRAL REPAIR INFERIOR;  Surgeon: Cammy Copa, MD;  Location: [facility] OR;  Service: Orthopedics;  Laterality: Right;    There were no vitals filed for this visit.   Subjective Assessment - 07/26/21 1234     Subjective 2-3 pain today out of 10 overall    Pertinent History History of recurrent dislocations, previous therapy and ultimately current surgery.    Limitations House hold activities;Lifting    Patient Stated Goals Reduce pain    Pain Onset More than a month ago              [facility] Adult PT Treatment/Exercise - 07/26/21 0001       Shoulder Exercises: Supine   External Rotation AAROM;Right    External Rotation Limitations 1# bar X 15, <30 deg    Flexion AAROM;Both    Flexion Limitations 1# bar X 15    ABduction AAROM;Right    ABduction Limitations 1# bar X 15      Shoulder Exercises: Standing   Other Standing Exercises UE ranger X 10 flexion, circles in flexion, abduction. Standing ball rolls OH 2# ball X 10 ea plane    Other Standing  Exercises wall ladder flexion and scaption to tolerance x 10 reps      Shoulder Exercises: Pulleys   Flexion 3 minutes    Flexion Limitations 5 sec hold    Scaption 3 minutes    Scaption Limitations 5 sec      Shoulder Exercises: ROM/Strengthening   UBE (Upper Arm Bike) L3 X 6 min switching at 3                      PT Short Term Goals - 07/19/21 1226       PT SHORT TERM GOAL #1   Title Patient will demonstrate independent use of home exercise program to maintain progress from in clinic treatments.    Time 3    Period Weeks    Status Achieved    Target Date 07/19/21   corrected from eval date              PT Long Term Goals - 06/28/21 1021       PT LONG TERM GOAL #1   Title Patient will demonstrate/report pain at worst less than or equal to 2/10 to facilitate minimal limitation in daily activity secondary to pain symptoms.    Time 10  Period Weeks    Status New    Target Date 09/06/21      PT LONG TERM GOAL #2   Title Patient will demonstrate independent use of home exercise program to facilitate ability to maintain/progress functional gains from skilled physical therapy services.    Time 10    Period Weeks    Status New    Target Date 09/06/21      PT LONG TERM GOAL #3   Title Pt. will demonstrate FOTO outcome > or = 62% to indicated reduced disability due to condition.    Time 10    Period Weeks    Status New    Target Date 09/06/21      PT LONG TERM GOAL #4   Title Patient will demonstrate Rt GH joint mobility WFL to facilitate usual self care, dressing, reaching overhead at PLOF s limitation due to symptoms.    Time 10    Period Weeks    Status New    Target Date 09/06/21      PT LONG TERM GOAL #5   Title Patient will demonstrate Rt UE MMT 5/5 throughout to facilitate usual lifting, carrying in functional activity to PLOF s limitation.    Time 10    Period Weeks    Status New    Target Date 09/06/21                    Plan - 07/26/21 1236     Clinical Impression Statement He had good tolerance to session today with AAROM focus into overhead ROM. We will continue to work to improve this with PT.    Examination-Activity Limitations Carry;Lift;Reach Overhead    Examination-Participation Restrictions Occupation;Laundry;Community Activity;Driving;Cleaning    Stability/Clinical Decision Making Stable/Uncomplicated    Rehab Potential Good    PT Frequency --   2x/week for first few weeks then 1x/week if appropriate   PT Duration Other (comment)   10 weeks   PT Treatment/Interventions ADLs/Self Care Home Management;Cryotherapy;Electrical Stimulation;Iontophoresis 4mg /ml Dexamethasone;Moist Heat;Balance training;Therapeutic exercise;Therapeutic activities;Functional mobility training;Stair training;Gait training;DME Instruction;Ultrasound;Neuromuscular re-education;Patient/family education;Passive range of motion;Spinal Manipulations;Joint Manipulations;Dry needling;Taping;Vasopneumatic Device;Manual techniques    PT Next Visit Plan still no lifting, Improve AAROM/AROM with overhead allowed, no update on ER restriction so keep < 30 deg for now, ?BFR    PT Home Exercise Plan [id number]    Consulted and Agree with Plan of Care Patient             Patient will benefit from skilled therapeutic intervention in order to improve the following deficits and impairments:  Hypomobility, Decreased endurance, Increased edema, Decreased activity tolerance, Decreased strength, Impaired UE functional use, Pain, Decreased mobility, Decreased range of motion, Impaired perceived functional ability, Improper body mechanics, Postural dysfunction, Impaired flexibility, Decreased coordination  Visit Diagnosis: Chronic right shoulder pain  Muscle weakness (generalized)  Localized edema  Abnormal posture     Problem List Patient Active Problem List   Diagnosis Date Noted   Prolapsed internal hemorrhoids, grade 2 04/12/2021   URI  (upper respiratory infection) 02/24/2021   B12 deficiency 10/31/2020   Paresthesia of left arm 10/30/2020   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   MCL sprain of right knee 08/15/2019   Acute prostatitis 03/06/2019   Hemorrhoid 03/06/2019   Acute shoulder bursitis, right 02/27/2019   Ganglion cyst of foot 02/27/2019   Asthma 04/28/2018   Constipation 07/08/2016   Right knee pain 05/29/2015   Shoulder instability, right 01/17/2015   Right shoulder pain 02/03/2014  Proteinuria 11/15/2013   Right inguinal hernia 09/27/2013   DOE (dyspnea on exertion) 06/10/2013   Venereal disease, unspecified 11/02/2012   Allergic rhinitis    External hemorrhoid, thrombosed 10/24/2012   Osgood-Schlatter's disease 10/28/2011   Encounter for well adult exam with abnormal findings 10/22/2011    Birdie Riddle 07/26/2021, 12:40 PM  [facility] Physical Therapy [address] [place], [place], [address] Phone: [phone]   Fax:  [phone]  Name: Xavier Cochran. MRN: [id number] Date of Birth: [birth year]

## 2021-08-04 ENCOUNTER — Encounter: Payer: Self-pay | Admitting: Rehabilitative and Restorative Service Providers"

## 2021-08-04 ENCOUNTER — Ambulatory Visit (INDEPENDENT_AMBULATORY_CARE_PROVIDER_SITE_OTHER): Payer: 59 | Admitting: Rehabilitative and Restorative Service Providers"

## 2021-08-04 ENCOUNTER — Other Ambulatory Visit: Payer: Self-pay

## 2021-08-04 DIAGNOSIS — M25511 Pain in right shoulder: Secondary | ICD-10-CM

## 2021-08-04 DIAGNOSIS — G8929 Other chronic pain: Secondary | ICD-10-CM

## 2021-08-04 DIAGNOSIS — R293 Abnormal posture: Secondary | ICD-10-CM

## 2021-08-04 DIAGNOSIS — R600 Localized edema: Secondary | ICD-10-CM

## 2021-08-04 DIAGNOSIS — M6281 Muscle weakness (generalized): Secondary | ICD-10-CM | POA: Diagnosis not present

## 2021-08-04 NOTE — Therapy (Addendum)
[facility] [address] [place], [place], [address] Phone: [phone]   Fax:  [phone]  Physical Therapy Treatment  Patient Details  Name: Xavier Cochran. MRN: [id number] Date of Birth: 12/09/1990 Referring Provider (PT): Dr. Cammy Copa   Encounter Date: 08/04/2021   PT End of Session - 08/04/21 1204     Visit Number 6    Number of Visits 12    Date for PT Re-Evaluation 09/06/21    Progress Note Due on Visit 10    PT Start Time 1142    PT Stop Time 1221    PT Time Calculation (min) 39 min    Activity Tolerance Patient tolerated treatment well    Behavior During Therapy [facility] for tasks assessed/performed             Past Medical History:  Diagnosis Date   Allergic rhinitis, cause unspecified    Hemorrhoids     Past Surgical History:  Procedure Laterality Date   SHOULDER ARTHROSCOPY WITH LABRAL REPAIR Right 06/01/2021   Procedure: RIGHT SHOULDER ARTHROSCOPY WITH LABRAL REPAIR INFERIOR;  Surgeon: Cammy Copa, MD;  Location: [facility] OR;  Service: Orthopedics;  Laterality: Right;    There were no vitals filed for this visit.   Subjective Assessment - 08/04/21 1145     Subjective Pt. indicated he felt sore in morning but nothing major.  No pain upon arrival today.    Pertinent History History of recurrent dislocations, previous therapy and ultimately current surgery.    Limitations House hold activities;Lifting    Patient Stated Goals Reduce pain    Currently in Pain? No/denies    Pain Score 0-No pain    Pain Onset More than a month ago                [facility] PT Assessment - 08/04/21 0001       Assessment   Medical Diagnosis S/P Rt shoulder arthroscopy and stabilization    Referring Provider (PT) Dr. Cammy Copa    Onset Date/Surgical Date 06/01/21    Hand Dominance Right      AROM   Right Shoulder Flexion 129 Degrees   in supine   Right Shoulder ABduction 105 Degrees   in supine   Right Shoulder  External Rotation 30 Degrees   in supine 45 deg abduction                          [facility] Adult PT Treatment/Exercise - 08/04/21 0001       Shoulder Exercises: Sidelying   ABduction Right      Shoulder Exercises: Standing   Other Standing Exercises rows, gh ext blue band 3 x 10 each, reactive ER walk outs c towel at side 5 sec hold x 10 blue band      Shoulder Exercises: ROM/Strengthening   UBE (Upper Arm Bike) Lvl 3.5 3 mins fwd/back each way                 PT Education - 08/04/21 1159     Education Details HEP progression    Person(s) Educated Patient    Methods Explanation;Demonstration;Verbal cues;Handout    Comprehension Verbalized understanding;Returned demonstration              PT Short Term Goals - 07/19/21 1226       PT SHORT TERM GOAL #1   Title Patient will demonstrate independent use of home exercise program to maintain progress from  in clinic treatments.    Time 3    Period Weeks    Status Achieved    Target Date 07/19/21   corrected from eval date              PT Long Term Goals - 08/04/21 1200       PT LONG TERM GOAL #1   Title Patient will demonstrate/report pain at worst less than or equal to 2/10 to facilitate minimal limitation in daily activity secondary to pain symptoms.    Time 10    Period Weeks    Status On-going    Target Date 09/06/21      PT LONG TERM GOAL #2   Title Patient will demonstrate independent use of home exercise program to facilitate ability to maintain/progress functional gains from skilled physical therapy services.    Time 10    Period Weeks    Status On-going    Target Date 09/06/21      PT LONG TERM GOAL #3   Title Pt. will demonstrate FOTO outcome > or = 62% to indicated reduced disability due to condition.    Time 10    Period Weeks    Status On-going    Target Date 09/06/21      PT LONG TERM GOAL #4   Title Patient will demonstrate Rt GH joint mobility WFL to facilitate  usual self care, dressing, reaching overhead at PLOF s limitation due to symptoms.    Time 10    Period Weeks    Status On-going    Target Date 09/06/21      PT LONG TERM GOAL #5   Title Patient will demonstrate Rt UE MMT 5/5 throughout to facilitate usual lifting, carrying in functional activity to PLOF s limitation.    Time 10    Period Weeks    Status On-going    Target Date 09/06/21                   Plan - 08/04/21 1202     Clinical Impression Statement Pt. continued to present c mobility restrictions in Rt shoulder as documented.  Continued use of manual and ther ex in clinic and use of HEP to help progress.  Inclusion of AROM into HEP at this time c good tolerance.  Continued skilled PT services indicated at this time.    Examination-Activity Limitations Carry;Lift;Reach Overhead    Examination-Participation Restrictions Occupation;Laundry;Community Activity;Driving;Cleaning    Stability/Clinical Decision Making Stable/Uncomplicated    Rehab Potential Good    PT Frequency --   2x/week for first few weeks then 1x/week if appropriate   PT Duration Other (comment)   10 weeks   PT Treatment/Interventions ADLs/Self Care Home Management;Cryotherapy;Electrical Stimulation;Iontophoresis 4mg /ml Dexamethasone;Moist Heat;Balance training;Therapeutic exercise;Therapeutic activities;Functional mobility training;Stair training;Gait training;DME Instruction;Ultrasound;Neuromuscular re-education;Patient/family education;Passive range of motion;Spinal Manipulations;Joint Manipulations;Dry needling;Taping;Vasopneumatic Device;Manual techniques    PT Next Visit Plan No lifting per MD note.  AROM transitioning in sidelying to sitting as tolerated without a shrug.  can use BFR in sidelying without resistance    PT Home Exercise Plan [email or web address]    Consulted and Agree with Plan of Care Patient             Patient will benefit from skilled therapeutic intervention in order to improve the  following deficits and impairments:  Hypomobility, Decreased endurance, Increased edema, Decreased activity tolerance, Decreased strength, Impaired UE functional use, Pain, Decreased mobility, Decreased range of motion, Impaired perceived functional ability, Improper body mechanics, Postural dysfunction, Impaired  flexibility, Decreased coordination  Visit Diagnosis: Chronic right shoulder pain  Muscle weakness (generalized)  Localized edema  Abnormal posture     Problem List Patient Active Problem List   Diagnosis Date Noted   Prolapsed internal hemorrhoids, grade 2 04/12/2021   URI (upper respiratory infection) 02/24/2021   B12 deficiency 10/31/2020   Paresthesia of left arm 10/30/2020   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   MCL sprain of right knee 08/15/2019   Acute prostatitis 03/06/2019   Hemorrhoid 03/06/2019   Acute shoulder bursitis, right 02/27/2019   Ganglion cyst of foot 02/27/2019   Asthma 04/28/2018   Constipation 07/08/2016   Right knee pain 05/29/2015   Shoulder instability, right 01/17/2015   Right shoulder pain 02/03/2014   Proteinuria 11/15/2013   Right inguinal hernia 09/27/2013   DOE (dyspnea on exertion) 06/10/2013   Venereal disease, unspecified 11/02/2012   Allergic rhinitis    External hemorrhoid, thrombosed 10/24/2012   Osgood-Schlatter's disease 10/28/2011   Encounter for well adult exam with abnormal findings 10/22/2011    Chyrel Masson, PT, DPT, OCS, ATC 08/04/21  12:12 PM    [facility] [address] [place], [place], [address] Phone: [phone]   Fax:  [phone]  Name: Ansel Ferrall. MRN: [id number] Date of Birth: 1991-08-08

## 2021-08-04 NOTE — Patient Instructions (Signed)
Access Code: [email or web address] URL: [email or web address] Date: 08/04/2021 Prepared by: Chyrel Masson  Exercises Supine Shoulder Flexion Extension AAROM with Dowel - 2 x daily - 7 x weekly - 2 sets - 10 reps - 5 hold Supine Shoulder Abduction AAROM with Dowel - 2 x daily - 7 x weekly - 2 sets - 10 reps - 5 hold Sidelying Shoulder External Rotation (Mirrored) - 2 x daily - 7 x weekly - 3 sets - 10 reps Sidelying Shoulder Abduction Palm Forward - 2 x daily - 7 x weekly - 3 sets - 10-15 reps Sidelying Shoulder Flexion 15 Degrees (Mirrored) - 2 x daily - 7 x weekly - 3 sets - 10 reps Standing Shoulder Row with Anchored Resistance - 2 x daily - 7 x weekly - 10 reps - 3 sets Shoulder Extension with Resistance - 2 x daily - 7 x weekly - 10 reps - 3 sets

## 2021-08-09 ENCOUNTER — Encounter: Payer: 59 | Admitting: Rehabilitative and Restorative Service Providers"

## 2021-08-10 ENCOUNTER — Telehealth: Payer: Self-pay | Admitting: Orthopedic Surgery

## 2021-08-10 NOTE — Telephone Encounter (Signed)
Pt called asking for a call back from Dr. August Saucer. Pt states he needs clearance for a new position he has coming up in Oct. Please call pt about this matter at [phone].

## 2021-08-12 NOTE — Telephone Encounter (Signed)
Tried calling to discuss. No answer. LMVM for him to call us back--we need additional information regarding what type of job he is needing clearance for and what his duties will be

## 2021-08-13 ENCOUNTER — Encounter: Payer: Self-pay | Admitting: Rehabilitative and Restorative Service Providers"

## 2021-08-13 ENCOUNTER — Other Ambulatory Visit: Payer: Self-pay

## 2021-08-13 ENCOUNTER — Ambulatory Visit (INDEPENDENT_AMBULATORY_CARE_PROVIDER_SITE_OTHER): Payer: 59 | Admitting: Rehabilitative and Restorative Service Providers"

## 2021-08-13 DIAGNOSIS — G8929 Other chronic pain: Secondary | ICD-10-CM

## 2021-08-13 DIAGNOSIS — R293 Abnormal posture: Secondary | ICD-10-CM

## 2021-08-13 DIAGNOSIS — M6281 Muscle weakness (generalized): Secondary | ICD-10-CM | POA: Diagnosis not present

## 2021-08-13 DIAGNOSIS — R600 Localized edema: Secondary | ICD-10-CM

## 2021-08-13 DIAGNOSIS — M25511 Pain in right shoulder: Secondary | ICD-10-CM

## 2021-08-13 NOTE — Therapy (Signed)
[facility] [address] [place], [place], [address] Phone: [phone]   Fax:  [phone]  Physical Therapy Treatment  Patient Details  Name: Xavier Cochran. MRN: [id number] Date of Birth: 06/23/1991 Referring Provider (PT): Dr. Cammy Copa   Encounter Date: 08/13/2021   PT End of Session - 08/13/21 1008     Visit Number 7    Number of Visits 12    Date for PT Re-Evaluation 09/06/21    Progress Note Due on Visit 10    PT Start Time 1008    PT Stop Time 1052    PT Time Calculation (min) 44 min    Activity Tolerance Patient tolerated treatment well    Behavior During Therapy [facility] for tasks assessed/performed             Past Medical History:  Diagnosis Date   Allergic rhinitis, cause unspecified    Hemorrhoids     Past Surgical History:  Procedure Laterality Date   SHOULDER ARTHROSCOPY WITH LABRAL REPAIR Right 06/01/2021   Procedure: RIGHT SHOULDER ARTHROSCOPY WITH LABRAL REPAIR INFERIOR;  Surgeon: Cammy Copa, MD;  Location: [facility] OR;  Service: Orthopedics;  Laterality: Right;    There were no vitals filed for this visit.   Subjective Assessment - 08/13/21 1011     Subjective Pt. reported no pain today, feeling better overall.    Pertinent History History of recurrent dislocations, previous therapy and ultimately current surgery.    Limitations House hold activities;Lifting    Patient Stated Goals Reduce pain    Currently in Pain? No/denies    Pain Score 0-No pain    Pain Onset More than a month ago                [facility] PT Assessment - 08/13/21 0001       Assessment   Medical Diagnosis S/P Rt shoulder arthroscopy and stabilization    Referring Provider (PT) Dr. Cammy Copa    Onset Date/Surgical Date 06/01/21    Hand Dominance Right      AROM   Overall AROM Comments Fwd elevation against gravity 120 degrees prior to compensatory elbow flexion                            [facility] Adult PT Treatment/Exercise - 08/13/21 0001       Blood Flow Restriction   Blood Flow Restriction Yes      Blood Flow Restriction-Positions    Blood Flow Restriction Position Standing      BFR Standing   Standing Limb Occulsion Pressure (mmHg) 145    Standing Exercise Pressure (mmHg) 75    Standing Exercise Prescription comment   see exercises below   Standing Exercise Prescription Comment size 2 cuff      Exercises   Exercises Shoulder      Shoulder Exercises: Sidelying   ABduction Right;Strengthening   BFR 75 mmHg x 30 , 3 x 15 c 30 sec rest break     Shoulder Exercises: Standing   External Rotation Right   BFR 75 mmHg green band x 30, 3 x 15 30 sec rest breaks, towel at side   Flexion Right;AROM   BFR 75 mmHg x30, 3 x 15 c 30 sec rest breaks 0-120 degrees     Shoulder Exercises: Pulleys   Flexion 3 minutes    Flexion Limitations 5 sec hold    ABduction 3 minutes    ABduction  Limitations 5 sec hold      Shoulder Exercises: ROM/Strengthening   UBE (Upper Arm Bike) Lvl 3.5 5 mins fwd/back each way BFR 75 mmHg                       PT Short Term Goals - 07/19/21 1226       PT SHORT TERM GOAL #1   Title Patient will demonstrate independent use of home exercise program to maintain progress from in clinic treatments.    Time 3    Period Weeks    Status Achieved    Target Date 07/19/21   corrected from eval date              PT Long Term Goals - 08/04/21 1200       PT LONG TERM GOAL #1   Title Patient will demonstrate/report pain at worst less than or equal to 2/10 to facilitate minimal limitation in daily activity secondary to pain symptoms.    Time 10    Period Weeks    Status On-going    Target Date 09/06/21      PT LONG TERM GOAL #2   Title Patient will demonstrate independent use of home exercise program to facilitate ability to maintain/progress functional gains from skilled physical therapy services.    Time 10    Period Weeks     Status On-going    Target Date 09/06/21      PT LONG TERM GOAL #3   Title Pt. will demonstrate FOTO outcome > or = 62% to indicated reduced disability due to condition.    Time 10    Period Weeks    Status On-going    Target Date 09/06/21      PT LONG TERM GOAL #4   Title Patient will demonstrate Rt GH joint mobility WFL to facilitate usual self care, dressing, reaching overhead at PLOF s limitation due to symptoms.    Time 10    Period Weeks    Status On-going    Target Date 09/06/21      PT LONG TERM GOAL #5   Title Patient will demonstrate Rt UE MMT 5/5 throughout to facilitate usual lifting, carrying in functional activity to PLOF s limitation.    Time 10    Period Weeks    Status On-going    Target Date 09/06/21                   Plan - 08/13/21 1026     Clinical Impression Statement Inclusion of BFR to Rt arm active movement for improved strengthening interventions with minimal to no weighted resistance (per MD protocol at this time).  Good response overall to performance.    Examination-Activity Limitations Carry;Lift;Reach Overhead    Examination-Participation Restrictions Occupation;Laundry;Community Activity;Driving;Cleaning    Stability/Clinical Decision Making Stable/Uncomplicated    Rehab Potential Good    PT Frequency --   2x/week for first few weeks then 1x/week if appropriate   PT Duration Other (comment)   10 weeks   PT Treatment/Interventions ADLs/Self Care Home Management;Cryotherapy;Electrical Stimulation;Iontophoresis 4mg /ml Dexamethasone;Moist Heat;Balance training;Therapeutic exercise;Therapeutic activities;Functional mobility training;Stair training;Gait training;DME Instruction;Ultrasound;Neuromuscular re-education;Patient/family education;Passive range of motion;Spinal Manipulations;Joint Manipulations;Dry needling;Taping;Vasopneumatic Device;Manual techniques    PT Next Visit Plan BFR use in active movements for improved strengthening.    PT  Home Exercise Plan [id number]    Consulted and Agree with Plan of Care Patient             Patient  will benefit from skilled therapeutic intervention in order to improve the following deficits and impairments:  Hypomobility, Decreased endurance, Increased edema, Decreased activity tolerance, Decreased strength, Impaired UE functional use, Pain, Decreased mobility, Decreased range of motion, Impaired perceived functional ability, Improper body mechanics, Postural dysfunction, Impaired flexibility, Decreased coordination  Visit Diagnosis: Chronic right shoulder pain  Muscle weakness (generalized)  Localized edema  Abnormal posture     Problem List Patient Active Problem List   Diagnosis Date Noted   Prolapsed internal hemorrhoids, grade 2 04/12/2021   URI (upper respiratory infection) 02/24/2021   B12 deficiency 10/31/2020   Paresthesia of left arm 10/30/2020   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   MCL sprain of right knee 08/15/2019   Acute prostatitis 03/06/2019   Hemorrhoid 03/06/2019   Acute shoulder bursitis, right 02/27/2019   Ganglion cyst of foot 02/27/2019   Asthma 04/28/2018   Constipation 07/08/2016   Right knee pain 05/29/2015   Shoulder instability, right 01/17/2015   Right shoulder pain 02/03/2014   Proteinuria 11/15/2013   Right inguinal hernia 09/27/2013   DOE (dyspnea on exertion) 06/10/2013   Venereal disease, unspecified 11/02/2012   Allergic rhinitis    External hemorrhoid, thrombosed 10/24/2012   Osgood-Schlatter's disease 10/28/2011   Encounter for well adult exam with abnormal findings 10/22/2011    Chyrel Masson, PT, DPT, OCS, ATC 08/13/21  10:44 AM    [facility] [address] [place], [place], [address] Phone: [phone]   Fax:  [phone]  Name: Derold Dorsch. MRN: [id number] Date of Birth: 1991-04-24

## 2021-08-16 ENCOUNTER — Encounter: Payer: Self-pay | Admitting: Rehabilitative and Restorative Service Providers"

## 2021-08-16 ENCOUNTER — Other Ambulatory Visit: Payer: Self-pay

## 2021-08-16 ENCOUNTER — Ambulatory Visit (INDEPENDENT_AMBULATORY_CARE_PROVIDER_SITE_OTHER): Payer: 59 | Admitting: Rehabilitative and Restorative Service Providers"

## 2021-08-16 ENCOUNTER — Telehealth: Payer: Self-pay | Admitting: Orthopedic Surgery

## 2021-08-16 DIAGNOSIS — R600 Localized edema: Secondary | ICD-10-CM | POA: Diagnosis not present

## 2021-08-16 DIAGNOSIS — R293 Abnormal posture: Secondary | ICD-10-CM | POA: Diagnosis not present

## 2021-08-16 DIAGNOSIS — M6281 Muscle weakness (generalized): Secondary | ICD-10-CM | POA: Diagnosis not present

## 2021-08-16 DIAGNOSIS — M25511 Pain in right shoulder: Secondary | ICD-10-CM | POA: Diagnosis not present

## 2021-08-16 DIAGNOSIS — G8929 Other chronic pain: Secondary | ICD-10-CM

## 2021-08-16 NOTE — Telephone Encounter (Signed)
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 Patient said he has (PT) upstairs at 11:45 am today and can pick letter up after (PT)  The number to contact patient is [phone]

## 2021-08-16 NOTE — Telephone Encounter (Signed)
Pt came down after PT and said we can upload it to [place] and he can print it from there

## 2021-08-16 NOTE — Therapy (Signed)
[facility] [address] [place], [place], [address] Phone: [phone]   Fax:  [phone]  Physical Therapy Treatment  Patient Details  Name: Xavier Cochran. MRN: [id number] Date of Birth: 1991/04/24 Referring Provider (PT): Dr. Cammy Copa   Encounter Date: 08/16/2021   PT End of Session - 08/16/21 1206     Visit Number 8    Number of Visits 12    Date for PT Re-Evaluation 09/06/21    Progress Note Due on Visit 10    PT Start Time 1145    PT Stop Time 1224    PT Time Calculation (min) 39 min    Activity Tolerance Patient tolerated treatment well    Behavior During Therapy [facility] for tasks assessed/performed             Past Medical History:  Diagnosis Date   Allergic rhinitis, cause unspecified    Hemorrhoids     Past Surgical History:  Procedure Laterality Date   SHOULDER ARTHROSCOPY WITH LABRAL REPAIR Right 06/01/2021   Procedure: RIGHT SHOULDER ARTHROSCOPY WITH LABRAL REPAIR INFERIOR;  Surgeon: Cammy Copa, MD;  Location: [facility] OR;  Service: Orthopedics;  Laterality: Right;    There were no vitals filed for this visit.   Subjective Assessment - 08/16/21 1202     Subjective Pt. indicated he felt increase in Rt shoulder soreness after last visit than before and felt some reduction from weekend but still noted.    Pertinent History History of recurrent dislocations, previous therapy and ultimately current surgery.    Limitations House hold activities;Lifting    Patient Stated Goals Reduce pain    Currently in Pain? Yes    Pain Score 3     Pain Location Shoulder    Pain Orientation Right    Pain Descriptors / Indicators Sore    Pain Type Chronic pain;Surgical pain    Pain Onset More than a month ago    Pain Frequency Intermittent    Aggravating Factors  lifting up arm    Pain Relieving Factors stretching                               [facility] Adult PT Treatment/Exercise - 08/16/21 0001        Shoulder Exercises: Supine   Other Supine Exercises cross arm stretch 30 sec x 5      Shoulder Exercises: Sidelying   External Rotation Right;Strengthening   BFR 75 mmHg x 30 , 3 x 15 c 30 sec rest break   ABduction Right;Strengthening   BFR 75 mmHg x 30 , 3 x 15 c 30 sec rest break     Shoulder Exercises: Standing   Flexion Right;AROM   BFR 75 mmHg x30, 3 x 15 c 30 sec rest breaks 0-120 degrees     Shoulder Exercises: Pulleys   Flexion 3 minutes    Flexion Limitations 5 sec hold    ABduction 3 minutes    ABduction Limitations 5 sec hold      Shoulder Exercises: ROM/Strengthening   UBE (Upper Arm Bike) Lvl 3.5 5 mins fwd/back each way BFR 75 mmHg                       PT Short Term Goals - 07/19/21 1226       PT SHORT TERM GOAL #1   Title Patient will demonstrate independent use of home exercise program to  maintain progress from in clinic treatments.    Time 3    Period Weeks    Status Achieved    Target Date 07/19/21   corrected from eval date              PT Long Term Goals - 08/04/21 1200       PT LONG TERM GOAL #1   Title Patient will demonstrate/report pain at worst less than or equal to 2/10 to facilitate minimal limitation in daily activity secondary to pain symptoms.    Time 10    Period Weeks    Status On-going    Target Date 09/06/21      PT LONG TERM GOAL #2   Title Patient will demonstrate independent use of home exercise program to facilitate ability to maintain/progress functional gains from skilled physical therapy services.    Time 10    Period Weeks    Status On-going    Target Date 09/06/21      PT LONG TERM GOAL #3   Title Pt. will demonstrate FOTO outcome > or = 62% to indicated reduced disability due to condition.    Time 10    Period Weeks    Status On-going    Target Date 09/06/21      PT LONG TERM GOAL #4   Title Patient will demonstrate Rt GH joint mobility WFL to facilitate usual self care, dressing, reaching  overhead at PLOF s limitation due to symptoms.    Time 10    Period Weeks    Status On-going    Target Date 09/06/21      PT LONG TERM GOAL #5   Title Patient will demonstrate Rt UE MMT 5/5 throughout to facilitate usual lifting, carrying in functional activity to PLOF s limitation.    Time 10    Period Weeks    Status On-going    Target Date 09/06/21                   Plan - 08/16/21 1207     Clinical Impression Statement Complaints of soreness noted but not reverting of objective data such as AROM movements or tolerance to intervention.  BFR was new last visit and could very well be reasoning for soreness noted. Continued plan for activie mobility gains and BFR strengthening without lifting per MD protocol.    Examination-Activity Limitations Carry;Lift;Reach Overhead    Examination-Participation Restrictions Occupation;Laundry;Community Activity;Driving;Cleaning    Stability/Clinical Decision Making Stable/Uncomplicated    Rehab Potential Good    PT Frequency --   2x/week for first few weeks then 1x/week if appropriate   PT Duration Other (comment)   10 weeks   PT Treatment/Interventions ADLs/Self Care Home Management;Cryotherapy;Electrical Stimulation;Iontophoresis 4mg /ml Dexamethasone;Moist Heat;Balance training;Therapeutic exercise;Therapeutic activities;Functional mobility training;Stair training;Gait training;DME Instruction;Ultrasound;Neuromuscular re-education;Patient/family education;Passive range of motion;Spinal Manipulations;Joint Manipulations;Dry needling;Taping;Vasopneumatic Device;Manual techniques    PT Next Visit Plan BFR use in active movements.  Possible infraspinatus trigger point release.    PT Home Exercise Plan [id number]    Consulted and Agree with Plan of Care Patient             Patient will benefit from skilled therapeutic intervention in order to improve the following deficits and impairments:  Hypomobility, Decreased endurance, Increased  edema, Decreased activity tolerance, Decreased strength, Impaired UE functional use, Pain, Decreased mobility, Decreased range of motion, Impaired perceived functional ability, Improper body mechanics, Postural dysfunction, Impaired flexibility, Decreased coordination  Visit Diagnosis: Chronic right shoulder pain  Muscle weakness (generalized)  Localized edema  Abnormal posture     Problem List Patient Active Problem List   Diagnosis Date Noted   Prolapsed internal hemorrhoids, grade 2 04/12/2021   URI (upper respiratory infection) 02/24/2021   B12 deficiency 10/31/2020   Paresthesia of left arm 10/30/2020   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   MCL sprain of right knee 08/15/2019   Acute prostatitis 03/06/2019   Hemorrhoid 03/06/2019   Acute shoulder bursitis, right 02/27/2019   Ganglion cyst of foot 02/27/2019   Asthma 04/28/2018   Constipation 07/08/2016   Right knee pain 05/29/2015   Shoulder instability, right 01/17/2015   Right shoulder pain 02/03/2014   Proteinuria 11/15/2013   Right inguinal hernia 09/27/2013   DOE (dyspnea on exertion) 06/10/2013   Venereal disease, unspecified 11/02/2012   Allergic rhinitis    External hemorrhoid, thrombosed 10/24/2012   Osgood-Schlatter's disease 10/28/2011   Encounter for well adult exam with abnormal findings 10/22/2011    Barbaraann Rondo, PT 08/16/2021, 12:23 PM  [facility] [address] [place], [place], [address] Phone: [phone]   Fax:  [phone]  Name: Earnestine Tuohey. MRN: [id number] Date of Birth: [birth year]

## 2021-08-18 ENCOUNTER — Ambulatory Visit (INDEPENDENT_AMBULATORY_CARE_PROVIDER_SITE_OTHER): Payer: 59 | Admitting: Internal Medicine

## 2021-08-18 ENCOUNTER — Other Ambulatory Visit: Payer: Self-pay

## 2021-08-18 ENCOUNTER — Encounter: Payer: Self-pay | Admitting: Internal Medicine

## 2021-08-18 VITALS — BP 120/76 | HR 80 | Temp 99.1°F | Ht 70.0 in | Wt 194.0 lb

## 2021-08-18 DIAGNOSIS — R739 Hyperglycemia, unspecified: Secondary | ICD-10-CM

## 2021-08-18 DIAGNOSIS — E538 Deficiency of other specified B group vitamins: Secondary | ICD-10-CM

## 2021-08-18 DIAGNOSIS — R319 Hematuria, unspecified: Secondary | ICD-10-CM | POA: Diagnosis not present

## 2021-08-18 DIAGNOSIS — K5900 Constipation, unspecified: Secondary | ICD-10-CM

## 2021-08-18 DIAGNOSIS — E559 Vitamin D deficiency, unspecified: Secondary | ICD-10-CM

## 2021-08-18 DIAGNOSIS — Z0001 Encounter for general adult medical examination with abnormal findings: Secondary | ICD-10-CM

## 2021-08-18 NOTE — Telephone Encounter (Signed)
Yes but needs rov before 10/24 thx

## 2021-08-18 NOTE — Progress Notes (Signed)
Patient ID: Xavier Noren., male   DOB: [birth year], 30 y.o.   MRN: [id number]        Chief Complaint: follow up HTN, ? Hematuria on DOT physical, mild dysuria, constipation       HPI:  Xavier Becker. is a 30 y.o. 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  Denies urinary symptoms such as dysuria, frequency, urgency, flank pain, hematuria or n/v, fever, chills., but did has incidentally mild dysuria for 2 days last wk, concerned about prostate, no other change in stream or retention symptoms, and all gu symptoms now resolved.  Did also have constipation last wk for several days wondering if related, not sure how to treat if happens again, but Denies worsening reflux, abd pain, dysphagia, n/v, other bowel change or blood. Pt denies chest pain, increased sob or doe, wheezing, orthopnea, PND, increased LE swelling, palpitations, dizziness or syncope.   Pt denies polydipsia, polyuria..          Wt Readings from Last 3 Encounters:  08/18/21 194 lb (88 kg)  06/01/21 190 lb (86.2 kg)  04/16/21 193 lb 6.4 oz (87.7 kg)   BP Readings from Last 3 Encounters:  08/18/21 120/76  06/01/21 114/84  04/16/21 116/82         Past Medical History:  Diagnosis Date   Allergic rhinitis, cause unspecified    Hemorrhoids    Past Surgical History:  Procedure Laterality Date   SHOULDER ARTHROSCOPY WITH LABRAL REPAIR Right 06/01/2021   Procedure: RIGHT SHOULDER ARTHROSCOPY WITH LABRAL REPAIR INFERIOR;  Surgeon: Cammy Copa, MD;  Location: [facility] OR;  Service: Orthopedics;  Laterality: Right;    reports that he has never smoked. He has never used smokeless tobacco. He reports current alcohol use. He reports that he does not use drugs. family history includes Lupus in his paternal grandmother. Allergies  Allergen Reactions   Doxycycline Nausea Only   No current outpatient medications on file prior to visit.   No current  facility-administered medications on file prior to visit.        ROS:  All others reviewed and negative.  Objective        PE:  BP 120/76 (BP Location: Right Arm, Patient Position: Sitting, Cuff Size: Normal)   Pulse 80   Temp 99.1 F (37.3 C) (Oral)   Ht 5\' 10"  (1.778 m)   Wt 194 lb (88 kg)   SpO2 98%   BMI 27.84 kg/m                 Constitutional: Pt appears in NAD               HENT: Head: NCAT.                Right Ear: External ear normal.                 Left Ear: External ear normal.                Eyes: . Pupils are equal, round, and reactive to light. Conjunctivae and EOM are normal               Nose: without d/c or deformity               Neck: Neck supple. Gross normal ROM  Cardiovascular: Normal rate and regular rhythm.                 Pulmonary/Chest: Effort normal and breath sounds without rales or wheezing.                Abd:  Soft, NT, ND, + BS, no organomegaly               Neurological: Pt is alert. At baseline orientation, motor grossly intact               Skin: Skin is warm. No rashes, no other new lesions, LE edema - none               Psychiatric: Pt behavior is normal without agitation   Micro: none  Cardiac tracings I have personally interpreted today:  none  Pertinent Radiological findings (summarize): none   Lab Results  Component Value Date   WBC 3.2 (L) 06/01/2021   HGB 15.0 06/01/2021   HCT 45.9 06/01/2021   PLT 256 06/01/2021   GLUCOSE 95 06/01/2021   CHOL 118 10/30/2020   TRIG 52.0 10/30/2020   HDL 46.50 10/30/2020   LDLCALC 61 10/30/2020   ALT 24 10/30/2020   AST 16 10/30/2020   NA 135 06/01/2021   K 3.3 (L) 06/01/2021   CL 102 06/01/2021   CREATININE 1.08 06/01/2021   BUN 10 06/01/2021   CO2 25 06/01/2021   TSH 1.97 10/30/2020   PSA 0.33 06/14/2019   Assessment/Plan:  Xavier Allinson. is a 30 y.o. Black or African American [2] male with  has a past medical history of Allergic rhinitis, cause unspecified and  Hemorrhoids.  B12 deficiency Lab Results  Component Value Date   VITAMINB12 174 (L) 10/30/2020   Low, reminded to start oral replacement - b12 1000 mcg qd   Hematuria No overt bleeding and o/w asymptomatic now though may have had mild transient prostatitis last wk?  For ua and culture,  to f/u any worsening symptoms or concerns  Constipation Mild, for colace bid prn,  to f/u any worsening symptoms or concerns  Followup: Return in about 2 months (around 11/01/2021).  [name], MD 08/21/2021 6:59 PM [facility] - [facility]

## 2021-08-18 NOTE — Patient Instructions (Addendum)
OK to try the OTC Colace up to twice per day for stool softening  Ok to try the [facility] for prostate  Please take B12 1000 mcg - 1 per day  Please continue all other medications as before, and refills have been done if requested.  Please have the pharmacy call with any other refills you may need.  Please continue your efforts at being more active, low cholesterol diet, and weight control.  Please keep your appointments with your specialists as you may have planned  Please go to the LAB at the blood drawing area for the tests to be done - just the urine testing today  You will be contacted by phone if any changes need to be made immediately.  Otherwise, you will receive a letter about your results with an explanation, but please check with MyChart first.  Please remember to sign up for MyChart if you have not done so, as this will be important to you in the future with finding out test results, communicating by private email, and scheduling acute appointments online when needed.  We'll see you back on Dec 5, and if you like you can have your blood tests done a few days ahead the [facility] Lab in the basement without an appointment

## 2021-08-19 ENCOUNTER — Encounter: Payer: Self-pay | Admitting: Internal Medicine

## 2021-08-19 LAB — URINALYSIS, ROUTINE W REFLEX MICROSCOPIC
Bilirubin Urine: NEGATIVE
Ketones, ur: NEGATIVE
Leukocytes,Ua: NEGATIVE
Nitrite: NEGATIVE
Specific Gravity, Urine: 1.025 (ref 1.000–1.030)
Total Protein, Urine: NEGATIVE
Urine Glucose: NEGATIVE
Urobilinogen, UA: 0.2 (ref 0.0–1.0)
pH: 6 (ref 5.0–8.0)

## 2021-08-19 LAB — URINE CULTURE: Result:: NO GROWTH

## 2021-08-20 ENCOUNTER — Encounter: Payer: Self-pay | Admitting: Internal Medicine

## 2021-08-21 ENCOUNTER — Encounter: Payer: Self-pay | Admitting: Internal Medicine

## 2021-08-21 DIAGNOSIS — R319 Hematuria, unspecified: Secondary | ICD-10-CM | POA: Insufficient documentation

## 2021-08-21 NOTE — Assessment & Plan Note (Signed)
No overt bleeding and o/w asymptomatic now though may have had mild transient prostatitis last wk?  For ua and culture,  to f/u any worsening symptoms or concerns

## 2021-08-21 NOTE — Assessment & Plan Note (Signed)
Lab Results  Component Value Date   VITAMINB12 174 (L) 10/30/2020   Low, reminded to start oral replacement - b12 1000 mcg qd

## 2021-08-21 NOTE — Assessment & Plan Note (Signed)
Mild, for colace bid prn,  to f/u any worsening symptoms or concerns

## 2021-08-23 ENCOUNTER — Encounter: Payer: 59 | Admitting: Rehabilitative and Restorative Service Providers"

## 2021-08-25 ENCOUNTER — Other Ambulatory Visit: Payer: Self-pay

## 2021-08-25 ENCOUNTER — Ambulatory Visit (INDEPENDENT_AMBULATORY_CARE_PROVIDER_SITE_OTHER): Payer: 59 | Admitting: Orthopedic Surgery

## 2021-08-25 ENCOUNTER — Encounter: Payer: Self-pay | Admitting: Orthopedic Surgery

## 2021-08-25 DIAGNOSIS — M25311 Other instability, right shoulder: Secondary | ICD-10-CM

## 2021-08-25 NOTE — Progress Notes (Signed)
Post-Op Visit Note   Patient: Xavier Cochran.           Date of Birth: Mar 11, 1991           MRN: [id number] Visit Date: 08/25/2021 PCP: [name], MD   Assessment & Plan:  Chief Complaint:  Chief Complaint  Patient presents with   Other      06/01/21 (12w 1d)   Right Shoulder Arthroscopy With Labral Repair Inferior - Right     Visit Diagnoses:  1. Shoulder instability, right     Plan: Patient is a 30 year old male who presents s/p right shoulder arthroscopy with labral repair.  He reports he has been going to physical therapy.  He has 1 session remaining.  He is also doing home exercise program and is pretty compliant with keeping up with this.  He is sleeping okay at night with a pillow below his right shoulder.  Not having to take any medication for pain.  Denies any instability events.  He does feel like his range of motion is significantly improving but he does note some continued soreness with range of motion.  On exam he has well-healed incisions from prior surgery.  Axillary nerve intact with deltoid firing.  Range of motion with 25 degrees external rotation, 85 degrees abduction, 130 degrees forward flexion.  Active motion equivalent to passive motion.  Range of motion is improved compared with prior office visit.  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  His daughter will be born in about 2 weeks and he should be okay for holding her but avoid any heavy lifting overhead with the shoulder.  Avoid riding dirt bikes for now.  41-month return for clinical recheck and determine back to work for full duty at that point.  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  Follow-Up Instructions: No follow-ups on file.   Orders:  No orders of the defined types were placed in this encounter.  No orders of the defined types were placed in this encounter.   Imaging: No results found.  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   MCL sprain of right knee 08/15/2019   Acute prostatitis 03/06/2019   Hemorrhoid 03/06/2019   Acute shoulder bursitis, right 02/27/2019   Ganglion cyst of foot 02/27/2019   Asthma 04/28/2018   Constipation 07/08/2016   Right knee pain 05/29/2015   Shoulder instability, right 01/17/2015   Right shoulder pain 02/03/2014   Proteinuria 11/15/2013   Right inguinal hernia 09/27/2013   DOE (dyspnea on exertion) 06/10/2013   Venereal disease, unspecified 11/02/2012   Allergic rhinitis    External hemorrhoid, thrombosed 10/24/2012   Osgood-Schlatter's disease 10/28/2011   Encounter for well adult exam with abnormal findings 10/22/2011   Past Medical History:  Diagnosis Date   Allergic rhinitis, cause unspecified    Hemorrhoids     Family History  Problem Relation Age of Onset   Lupus Paternal Grandmother     Past Surgical History:  Procedure Laterality Date   SHOULDER ARTHROSCOPY WITH LABRAL REPAIR Right 06/01/2021   Procedure: RIGHT SHOULDER ARTHROSCOPY WITH LABRAL REPAIR INFERIOR;  Surgeon: Cammy Copa, MD;  Location: [facility] OR;  Service: Orthopedics;  Laterality: Right;   Social History   Occupational History   Occupation: Audiological scientist  Tobacco Use   Smoking status: Never   Smokeless tobacco: Never  Vaping Use   Vaping Use: Never used  Substance and Sexual Activity   Alcohol use: Yes    Comment: occasional   Drug use: No   Sexual activity: Not on file

## 2021-08-26 ENCOUNTER — Encounter: Payer: Self-pay | Admitting: Physical Therapy

## 2021-08-26 ENCOUNTER — Ambulatory Visit (INDEPENDENT_AMBULATORY_CARE_PROVIDER_SITE_OTHER): Payer: 59 | Admitting: Physical Therapy

## 2021-08-26 DIAGNOSIS — M6281 Muscle weakness (generalized): Secondary | ICD-10-CM | POA: Diagnosis not present

## 2021-08-26 DIAGNOSIS — M25511 Pain in right shoulder: Secondary | ICD-10-CM | POA: Diagnosis not present

## 2021-08-26 DIAGNOSIS — R293 Abnormal posture: Secondary | ICD-10-CM

## 2021-08-26 DIAGNOSIS — G8929 Other chronic pain: Secondary | ICD-10-CM

## 2021-08-26 DIAGNOSIS — R600 Localized edema: Secondary | ICD-10-CM | POA: Diagnosis not present

## 2021-08-26 NOTE — Therapy (Signed)
[facility] [address] [place], [place], [address] Phone: [phone]   Fax:  [phone]  Physical Therapy Treatment  Patient Details  Name: Xavier Cochran. MRN: [id number] Date of Birth: Sep 25, 1991 Referring Provider (PT): Dr. Cammy Copa   Encounter Date: 08/26/2021   PT End of Session - 08/26/21 1040     Visit Number 9    Number of Visits 12    Date for PT Re-Evaluation 09/06/21    Progress Note Due on Visit 10    PT Start Time 1015    PT Stop Time 1055    PT Time Calculation (min) 40 min    Activity Tolerance Patient tolerated treatment well    Behavior During Therapy [facility] for tasks assessed/performed             Past Medical History:  Diagnosis Date   Allergic rhinitis, cause unspecified    Hemorrhoids     Past Surgical History:  Procedure Laterality Date   SHOULDER ARTHROSCOPY WITH LABRAL REPAIR Right 06/01/2021   Procedure: RIGHT SHOULDER ARTHROSCOPY WITH LABRAL REPAIR INFERIOR;  Surgeon: Cammy Copa, MD;  Location: [facility] OR;  Service: Orthopedics;  Laterality: Right;    There were no vitals filed for this visit.   Subjective Assessment - 08/26/21 1026     Subjective He relays his shoulder is doing pretty well but he feels like he still needs some PT and will set up more appointments after session today. He says about 1-2 out of 10 pain overall in his Rt shoulder    Pertinent History History of recurrent dislocations, previous therapy and ultimately current surgery.    Limitations House hold activities;Lifting    Patient Stated Goals Reduce pain    Pain Onset More than a month ago               [facility] Adult PT Treatment/Exercise - 08/26/21 0001       BFR Standing   Standing Limb Occulsion Pressure (mmHg) 145    Standing Exercise Pressure (mmHg) 75    Standing Exercise Prescription Comment size 2 cuff      Shoulder Exercises: Standing   External Rotation Both    Theraband Level (Shoulder  External Rotation) Level 3 (Green)    External Rotation Limitations with BFR 30 reps then 3X15 with 30 sec rest    Flexion Both    Shoulder Flexion Weight (lbs) 1    Flexion Limitations with BFR 30 reps then 3X15 with 30 sec rest    ABduction Both    Shoulder ABduction Weight (lbs) 1    ABduction Limitations with BFR 4 X15 with 30 sec rest. PT provided manual assistance to reduce shoulder shrug    Other Standing Exercises bilat facepull (high row combo with ER) red band with BFR 4X15 with 30 sec rest      Shoulder Exercises: Pulleys   Flexion 2 minutes    Flexion Limitations 5 sec hold    ABduction 2 minutes    ABduction Limitations 5 sec hold      Shoulder Exercises: ROM/Strengthening   UBE (Upper Arm Bike) L4 3 min fwd, 3 min retro                       PT Short Term Goals - 07/19/21 1226       PT SHORT TERM GOAL #1   Title Patient will demonstrate independent use of home exercise program to maintain  progress from in clinic treatments.    Time 3    Period Weeks    Status Achieved    Target Date 07/19/21   corrected from eval date              PT Long Term Goals - 08/04/21 1200       PT LONG TERM GOAL #1   Title Patient will demonstrate/report pain at worst less than or equal to 2/10 to facilitate minimal limitation in daily activity secondary to pain symptoms.    Time 10    Period Weeks    Status On-going    Target Date 09/06/21      PT LONG TERM GOAL #2   Title Patient will demonstrate independent use of home exercise program to facilitate ability to maintain/progress functional gains from skilled physical therapy services.    Time 10    Period Weeks    Status On-going    Target Date 09/06/21      PT LONG TERM GOAL #3   Title Pt. will demonstrate FOTO outcome > or = 62% to indicated reduced disability due to condition.    Time 10    Period Weeks    Status On-going    Target Date 09/06/21      PT LONG TERM GOAL  #4   Title Patient will demonstrate Rt GH joint mobility WFL to facilitate usual self care, dressing, reaching overhead at PLOF s limitation due to symptoms.    Time 10    Period Weeks    Status On-going    Target Date 09/06/21      PT LONG TERM GOAL #5   Title Patient will demonstrate Rt UE MMT 5/5 throughout to facilitate usual lifting, carrying in functional activity to PLOF s limitation.    Time 10    Period Weeks    Status On-going    Target Date 09/06/21                   Plan - 08/26/21 1042     Clinical Impression Statement MD progress note looks positive but MD still does not want him lifting overhead. We continued to work with strength improvments using BFR to his tolerance. Continue POC.    Examination-Activity Limitations Carry;Lift;Reach Overhead    Examination-Participation Restrictions Occupation;Laundry;Community Activity;Driving;Cleaning    Stability/Clinical Decision Making Stable/Uncomplicated    Rehab Potential Good    PT Frequency --   2x/week for first few weeks then 1x/week if appropriate   PT Duration Other (comment)   10 weeks   PT Treatment/Interventions ADLs/Self Care Home Management;Cryotherapy;Electrical Stimulation;Iontophoresis 4mg /ml Dexamethasone;Moist Heat;Balance training;Therapeutic exercise;Therapeutic activities;Functional mobility training;Stair training;Gait training;DME Instruction;Ultrasound;Neuromuscular re-education;Patient/family education;Passive range of motion;Spinal Manipulations;Joint Manipulations;Dry needling;Taping;Vasopneumatic Device;Manual techniques    PT Next Visit Plan BFR use in active movements.  Possible infraspinatus trigger point release.    PT Home Exercise Plan [id number]    Consulted and Agree with Plan of Care Patient             Patient will benefit from skilled therapeutic intervention in order to improve the following deficits and impairments:  Hypomobility, Decreased endurance, Increased edema,  Decreased activity tolerance, Decreased strength, Impaired UE functional use, Pain, Decreased mobility, Decreased range of motion, Impaired perceived functional ability, Improper body mechanics, Postural dysfunction, Impaired flexibility, Decreased coordination  Visit Diagnosis: Chronic right shoulder pain  Muscle weakness (generalized)  Localized edema  Abnormal posture     Problem List Patient Active Problem List   Diagnosis Date  Noted   Hematuria 08/21/2021   Prolapsed internal hemorrhoids, grade 2 04/12/2021   URI (upper respiratory infection) 02/24/2021   B12 deficiency 10/31/2020   Paresthesia of left arm 10/30/2020   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   MCL sprain of right knee 08/15/2019   Acute prostatitis 03/06/2019   Hemorrhoid 03/06/2019   Acute shoulder bursitis, right 02/27/2019   Ganglion cyst of foot 02/27/2019   Asthma 04/28/2018   Constipation 07/08/2016   Right knee pain 05/29/2015   Shoulder instability, right 01/17/2015   Right shoulder pain 02/03/2014   Proteinuria 11/15/2013   Right inguinal hernia 09/27/2013   DOE (dyspnea on exertion) 06/10/2013   Venereal disease, unspecified 11/02/2012   Allergic rhinitis    External hemorrhoid, thrombosed 10/24/2012   Osgood-Schlatter's disease 10/28/2011   Encounter for well adult exam with abnormal findings 10/22/2011    April Manson, PT,DPT 08/26/2021, 10:55 AM  [facility] [address] [place], [place], [address] Phone: [phone]   Fax:  [phone]  Name: Xavier Cochran. MRN: [id number] Date of Birth: [birth year]

## 2021-08-30 ENCOUNTER — Encounter: Payer: Self-pay | Admitting: Rehabilitative and Restorative Service Providers"

## 2021-08-30 ENCOUNTER — Other Ambulatory Visit: Payer: Self-pay

## 2021-08-30 ENCOUNTER — Ambulatory Visit (INDEPENDENT_AMBULATORY_CARE_PROVIDER_SITE_OTHER): Payer: 59 | Admitting: Rehabilitative and Restorative Service Providers"

## 2021-08-30 DIAGNOSIS — R600 Localized edema: Secondary | ICD-10-CM | POA: Diagnosis not present

## 2021-08-30 DIAGNOSIS — M6281 Muscle weakness (generalized): Secondary | ICD-10-CM | POA: Diagnosis not present

## 2021-08-30 DIAGNOSIS — G8929 Other chronic pain: Secondary | ICD-10-CM

## 2021-08-30 DIAGNOSIS — M25511 Pain in right shoulder: Secondary | ICD-10-CM | POA: Diagnosis not present

## 2021-08-30 DIAGNOSIS — R293 Abnormal posture: Secondary | ICD-10-CM | POA: Diagnosis not present

## 2021-08-30 NOTE — Therapy (Addendum)
[facility] Therapy [address] [place], [place], [address] Phone: [phone]   Fax:  [phone]  Physical Therapy Treatment/Discharge   Patient Details  Name: Xavier Cochran. MRN: [id number] Date of Birth: 1991/08/11 Referring Provider (PT): Dr. Meredith Pel   Encounter Date: 08/30/2021   PT End of Session - 08/30/21 1602     Visit Number 10    Number of Visits 12    Date for PT Re-Evaluation 09/06/21    Progress Note Due on Visit --   held progress note.  Recert next visit if returning   PT Start Time 1558    PT Stop Time 1637    PT Time Calculation (min) 39 min    Activity Tolerance Patient tolerated treatment well    Behavior During Therapy WFL for tasks assessed/performed             Past Medical History:  Diagnosis Date   Allergic rhinitis, cause unspecified    Hemorrhoids     Past Surgical History:  Procedure Laterality Date   SHOULDER ARTHROSCOPY WITH LABRAL REPAIR Right 06/01/2021   Procedure: RIGHT SHOULDER ARTHROSCOPY WITH LABRAL REPAIR INFERIOR;  Surgeon: Meredith Pel, MD;  Location: [place];  Service: Orthopedics;  Laterality: Right;    There were no vitals filed for this visit.   Subjective Assessment - 08/30/21 1603     Subjective Pt. stated no pain upon arrival today.  Rated overall progression at 75%.    Pertinent History History of recurrent dislocations, previous therapy and ultimately current surgery.    Limitations House hold activities;Lifting    Patient Stated Goals Reduce pain    Currently in Pain? No/denies    Pain Score 0-No pain    Pain Onset More than a month ago                [facility] PT Assessment - 08/30/21 0001       Assessment   Medical Diagnosis S/P Rt shoulder arthroscopy and stabilization    Referring Provider (PT) Dr. Meredith Pel    Onset Date/Surgical Date 06/01/21    Hand Dominance Right      Observation/Other Assessments   Focus on Therapeutic Outcomes (FOTO)   update 59%      AROM   Right Shoulder Flexion 132 Degrees   in supine   Right Shoulder ABduction 140 Degrees   in supine   Right Shoulder Internal Rotation 52 Degrees   in supine 45 deg abduction   Right Shoulder External Rotation 36 Degrees   in supine 45 deg abduction     Strength   Right Shoulder Flexion 5/5   22.8, 26.2 lbs   Right Shoulder ABduction 5/5   23.4, 22.4 lbs   Right Shoulder External Rotation 5/5   21.1, 22.6 lbs   Left Shoulder Flexion 5/5   43, 41 lbs   Left Shoulder ABduction 5/5   25, 29 lbs   Left Shoulder External Rotation 5/5   24.1, 24.9 lbs                          [facility] Adult PT Treatment/Exercise - 08/30/21 0001       BFR Standing   Standing Limb Occulsion Pressure (mmHg) 145    Standing Exercise Pressure (mmHg) 75    Standing Exercise Prescription Comment size 2 cuff      Exercises   Other Exercises  HEP review      Shoulder  Exercises: Standing   Other Standing Exercises standing 0-90 flexion to 90-0 abduction and reverse 1 lb x 10    Other Standing Exercises standing blue band ER 2 x 10 c towel at side, standing d2 extension Rt arm x 10, standing 3 point lateral pulls blue band x 10 each side at 100 deg flexion on wall      Shoulder Exercises: ROM/Strengthening   UBE (Upper Arm Bike) Lvl 4 5 mins fwd/back c BFR 75 mmHg      Shoulder Exercises: Stretch   Other Shoulder Stretches doorway arm at side ER stretch 30 sec x 3 Rt                       PT Short Term Goals - 07/19/21 1226       PT SHORT TERM GOAL #1   Title Patient will demonstrate independent use of home exercise program to maintain progress from in clinic treatments.    Time 3    Period Weeks    Status Achieved    Target Date 07/19/21   corrected from eval date              PT Long Term Goals - 08/30/21 1607       PT LONG TERM GOAL #1   Title Patient will demonstrate/report pain at worst less than or equal to 2/10 to facilitate minimal  limitation in daily activity secondary to pain symptoms.    Time 10    Period Weeks    Status Achieved    Target Date 09/06/21      PT LONG TERM GOAL #2   Title Patient will demonstrate independent use of home exercise program to facilitate ability to maintain/progress functional gains from skilled physical therapy services.    Time 10    Period Weeks    Status Achieved    Target Date 09/06/21      PT LONG TERM GOAL #3   Title Pt. will demonstrate FOTO outcome > or = 62% to indicated reduced disability due to condition.    Time 10    Period Weeks    Status Partially Met      PT LONG TERM GOAL #4   Title Patient will demonstrate Rt [place] joint mobility WFL to facilitate usual self care, dressing, reaching overhead at PLOF s limitation due to symptoms.    Time 10    Period Weeks    Status Partially Met      PT LONG TERM GOAL #5   Title Patient will demonstrate Rt UE MMT 5/5 throughout to facilitate usual lifting, carrying in functional activity to PLOF s limitation.    Time 10    Period Weeks    Status Achieved                   Plan - 08/30/21 1608     Clinical Impression Statement Pt. has demonstrated good objective improvement and subjective improvement to this point.  Pt. did continue to present c strength deficits primarily at this time.  Good knowledge of HEP and overall reported improvement warranted trial HEP at this time.    Examination-Activity Limitations Carry;Lift;Reach Overhead    Examination-Participation Restrictions Occupation;Laundry;Community Activity;Driving;Cleaning    Stability/Clinical Decision Making Stable/Uncomplicated    Rehab Potential Good    PT Frequency --   2x/week for first few weeks then 1x/week if appropriate   PT Duration Other (comment)   10 weeks   PT Treatment/Interventions ADLs/Self  Care Home Management;Cryotherapy;Electrical Stimulation;Iontophoresis 54m/ml Dexamethasone;Moist Heat;Balance training;Therapeutic exercise;Therapeutic  activities;Functional mobility training;Stair training;Gait training;DME Instruction;Ultrasound;Neuromuscular re-education;Patient/family education;Passive range of motion;Spinal Manipulations;Joint Manipulations;Dry needling;Taping;Vasopneumatic Device;Manual techniques    PT Next Visit Plan Trial HEP, return for recertification if necessary    PT Home Exercise Plan [id number]    Consulted and Agree with Plan of Care Patient             Patient will benefit from skilled therapeutic intervention in order to improve the following deficits and impairments:  Hypomobility, Decreased endurance, Increased edema, Decreased activity tolerance, Decreased strength, Impaired UE functional use, Pain, Decreased mobility, Decreased range of motion, Impaired perceived functional ability, Improper body mechanics, Postural dysfunction, Impaired flexibility, Decreased coordination  Visit Diagnosis: Chronic right shoulder pain  Muscle weakness (generalized)  Localized edema  Abnormal posture     Problem List Patient Active Problem List   Diagnosis Date Noted   Hematuria 08/21/2021   Prolapsed internal hemorrhoids, grade 2 04/12/2021   URI (upper respiratory infection) 02/24/2021   B12 deficiency 10/31/2020   Paresthesia of left arm 10/30/2020   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   MCL sprain of right knee 08/15/2019   Acute prostatitis 03/06/2019   Hemorrhoid 03/06/2019   Acute shoulder bursitis, right 02/27/2019   Ganglion cyst of foot 02/27/2019   Asthma 04/28/2018   Constipation 07/08/2016   Right knee pain 05/29/2015   Shoulder instability, right 01/17/2015   Right shoulder pain 02/03/2014   Proteinuria 11/15/2013   Right inguinal hernia 09/27/2013   DOE (dyspnea on exertion) 06/10/2013   Venereal disease, unspecified 11/02/2012   Allergic rhinitis    External hemorrhoid, thrombosed 10/24/2012   Osgood-Schlatter's disease 10/28/2011   Encounter for well adult exam with  abnormal findings 10/22/2011    MScot Jun PT, DPT, OCS, ATC 08/30/21  4:33 PM  PHYSICAL THERAPY DISCHARGE SUMMARY  Visits from Start of Care: 10  Current functional level related to goals / functional outcomes: See note   Remaining deficits: See note   Education / Equipment: HEP   Patient agrees to discharge. Patient goals were met. Patient is being discharged due to being pleased with the current functional level.  MScot Jun PT, DPT, OCS, ATC 11/02/21  10:55 AM       [facility] [address] [address]Phone: [phone]  Fax:  [phone] Name: Xavier Cochran MRN: [id number] of Birth: 519-Apr-1992

## 2021-10-15 ENCOUNTER — Telehealth: Payer: Self-pay | Admitting: Orthopedic Surgery

## 2021-10-15 ENCOUNTER — Other Ambulatory Visit: Payer: Self-pay | Admitting: Surgical

## 2021-10-15 MED ORDER — TRAMADOL HCL 50 MG PO TABS: 50.0000 mg | ORAL_TABLET | Freq: Two times a day (BID) | ORAL | 0 refills | Status: AC | PRN

## 2021-10-15 NOTE — Telephone Encounter (Signed)
Please advise 

## 2021-10-15 NOTE — Telephone Encounter (Signed)
Pt called requesting pain meds. Pt states he has an upcoming appt on 12/5. Pt states he was moving a desk and think he pulled something. Please call pt about this matter at [phone].

## 2021-10-15 NOTE — Telephone Encounter (Signed)
I called patient and advised. 

## 2021-10-15 NOTE — Telephone Encounter (Signed)
Sent in Tramadol  

## 2021-11-01 ENCOUNTER — Encounter: Payer: 59 | Admitting: Internal Medicine

## 2021-11-01 ENCOUNTER — Ambulatory Visit: Payer: 59 | Admitting: Orthopedic Surgery

## 2021-11-15 ENCOUNTER — Ambulatory Visit: Payer: 59 | Admitting: Orthopedic Surgery

## 2022-03-09 ENCOUNTER — Ambulatory Visit: Payer: 59 | Admitting: Internal Medicine

## 2022-08-09 ENCOUNTER — Ambulatory Visit: Payer: Self-pay | Admitting: Internal Medicine

## 2022-11-10 ENCOUNTER — Encounter: Payer: Self-pay | Admitting: Internal Medicine

## 2022-11-10 ENCOUNTER — Telehealth (INDEPENDENT_AMBULATORY_CARE_PROVIDER_SITE_OTHER): Payer: BC Managed Care – PPO | Admitting: Internal Medicine

## 2022-11-10 DIAGNOSIS — J309 Allergic rhinitis, unspecified: Secondary | ICD-10-CM | POA: Diagnosis not present

## 2022-11-10 DIAGNOSIS — J069 Acute upper respiratory infection, unspecified: Secondary | ICD-10-CM | POA: Diagnosis not present

## 2022-11-10 DIAGNOSIS — J4530 Mild persistent asthma, uncomplicated: Secondary | ICD-10-CM

## 2022-11-10 MED ORDER — HYDROCODONE BIT-HOMATROP MBR 5-1.5 MG/5ML PO SOLN: 5.0000 mL | Freq: Four times a day (QID) | ORAL | 0 refills | Status: AC | PRN

## 2022-11-10 MED ORDER — LEVOFLOXACIN 500 MG PO TABS: 500.0000 mg | ORAL_TABLET | Freq: Every day | ORAL | 0 refills | Status: AC

## 2022-11-10 NOTE — Assessment & Plan Note (Signed)
Overall stable, cont inhaler prn 

## 2022-11-10 NOTE — Assessment & Plan Note (Signed)
Mild to mod, also for otc nasacort asd,  to f/u any worsening symptoms or concerns 

## 2022-11-10 NOTE — Assessment & Plan Note (Signed)
Mild to mod, for antibx course levaquin 500 qd, hycodan prn cough,  to f/u any worsening symptoms or concerns

## 2022-11-10 NOTE — Patient Instructions (Signed)
Please take all new medication as prescribed 

## 2022-11-10 NOTE — Progress Notes (Signed)
Patient ID: Xavier Cochran., male   DOB: Sep 01, 1991, 31 y.o.   MRN: [id number]  Virtual Visit via Video Note  I connected with Xavier Cochran. on 11/10/22 at  1:00 PM EST by a video enabled telemedicine application and verified that I am speaking with the correct person using two identifiers.  Location of all participants today Patient: at home Provider: at office    I discussed the limitations of evaluation and management by telemedicine and the availability of in person appointments. The patient expressed understanding and agreed to proceed.  History of Present Illness:  Here with 2-3 days acute onset fever, facial pain, pressure, headache, general weakness and malaise, and greenish d/c, with mild ST and cough, but pt denies chest pain, wheezing, increased sob or doe, orthopnea, PND, increased LE swelling, palpitations, dizziness or syncope.  Does have several wks ongoing nasal allergy symptoms with clearish congestion, itch and sneezing, without fever, pain, ST, cough, swelling or wheezing.   Pt denies polydipsia, polyuria, or new focal neuro s/s.    Past Medical History:  Diagnosis Date   Allergic rhinitis, cause unspecified    Hemorrhoids    Past Surgical History:  Procedure Laterality Date   SHOULDER ARTHROSCOPY WITH LABRAL REPAIR Right 06/01/2021   Procedure: RIGHT SHOULDER ARTHROSCOPY WITH LABRAL REPAIR INFERIOR;  Surgeon: Cammy Copa, MD;  Location: [facility];  Service: Orthopedics;  Laterality: Right;    reports that he has never smoked. He has never used smokeless tobacco. He reports current alcohol use. He reports that he does not use drugs. family history includes Lupus in his paternal grandmother. Allergies  Allergen Reactions   Doxycycline Nausea Only    Observations/Objective: Alert, NAD, appropriate mood and affect, resps normal, cn 2-12 intact, moves all 4s, no visible rash or swelling Lab Results  Component Value Date   WBC 3.2 (L) 06/01/2021   HGB 15.0 06/01/2021    HCT 45.9 06/01/2021   PLT 256 06/01/2021   GLUCOSE 95 06/01/2021   CHOL 118 10/30/2020   TRIG 52.0 10/30/2020   HDL 46.50 10/30/2020   LDLCALC 61 10/30/2020   ALT 24 10/30/2020   AST 16 10/30/2020   NA 135 06/01/2021   K 3.3 (L) 06/01/2021   CL 102 06/01/2021   CREATININE 1.08 06/01/2021   BUN 10 06/01/2021   CO2 25 06/01/2021   TSH 1.97 10/30/2020   PSA 0.33 06/14/2019   Assessment and Plan: See notes  Follow Up Instructions: See notes   I discussed the assessment and treatment plan with the patient. The patient was provided an opportunity to ask questions and all were answered. The patient agreed with the plan and demonstrated an understanding of the instructions.   The patient was advised to call back or seek an in-person evaluation if the symptoms worsen or if the condition fails to improve as anticipated.   Xavier Barre, MD

## 2022-11-18 ENCOUNTER — Ambulatory Visit (INDEPENDENT_AMBULATORY_CARE_PROVIDER_SITE_OTHER): Payer: BC Managed Care – PPO | Admitting: Sports Medicine

## 2022-11-18 ENCOUNTER — Ambulatory Visit (INDEPENDENT_AMBULATORY_CARE_PROVIDER_SITE_OTHER): Payer: BC Managed Care – PPO

## 2022-11-18 VITALS — BP 118/80 | HR 75 | Ht 70.0 in | Wt 198.0 lb

## 2022-11-18 DIAGNOSIS — M79674 Pain in right toe(s): Secondary | ICD-10-CM

## 2022-11-18 DIAGNOSIS — M79671 Pain in right foot: Secondary | ICD-10-CM

## 2022-11-18 MED ORDER — MELOXICAM 15 MG PO TABS: 15.0000 mg | ORAL_TABLET | Freq: Every day | ORAL | 0 refills | Status: DC

## 2022-11-18 NOTE — Patient Instructions (Addendum)
Good to see you - Start meloxicam 15 mg daily x2 weeks.  If still having pain after 2 weeks, complete 3rd-week of meloxicam. May use remaining meloxicam as needed once daily for pain control.  Do not to use additional NSAIDs while taking meloxicam.  May use Tylenol [phone] mg 2 to 3 times a day for breakthrough pain. Recommend going to [employer] and discuss a cushioned insert or a wide toe box feels better  As needed follow up if no improvement 3-4 weeks

## 2022-11-18 NOTE — Progress Notes (Signed)
    Aleen Sells D.[facility] [address] [place] [address] Phone: [phone]   Assessment and Plan:     1. Right foot pain 2. Great toe pain, right  -Chronic with exacerbation, initial sports medicine visit - Intermittent pain of right first toe that has worsened over the past 1 to 2 months.  Most consistent with discomfort from pressure of callus formation on first toe - Start meloxicam 15 mg daily x2 weeks.  If still having pain after 2 weeks, complete 3rd-week of meloxicam. May use remaining meloxicam as needed once daily for pain control.  Do not to use additional NSAIDs while taking meloxicam.  May use Tylenol [phone] mg 2 to 3 times a day for breakthrough pain. - Recommend trying wide toe box shoe versus cushion inserts to try and take pressure off of callus.  Can go to [employer] or similar store - X-ray obtained in clinic.  My interpretation: No acute fracture or dislocation.  Pertinent previous records reviewed include none   Follow Up: As needed if no improvement or worsening of symptoms in 3 to 4 weeks.  Could consider ultrasound versus callus shaving   Subjective:   I, Moenique Parris, am serving as a Neurosurgeon for Doctor Richardean Sale  Chief Complaint: right toe pain   HPI:   11/18/22 Patient is a 31 year old male complaining of right toe pain. Patient states that he has had pain for a while , intermittent pain for the last month, had sharp pain when walking yesterday, no numbness and tingling constantly, no meds for the pain , right great toe, no radiating pain   Relevant Historical Information: None pertinent  Additional pertinent review of systems negative.   Current Outpatient Medications:    HYDROcodone bit-homatropine (HYCODAN) 5-1.5 MG/5ML syrup, Take 5 mLs by mouth every 6 (six) hours as needed for up to 10 days., Disp: 180 mL, Rfl: 0   levofloxacin (LEVAQUIN) 500 MG tablet, Take 1 tablet (500 mg total) by mouth  daily for 10 days., Disp: 10 tablet, Rfl: 0   meloxicam (MOBIC) 15 MG tablet, Take 1 tablet (15 mg total) by mouth daily., Disp: 30 tablet, Rfl: 0   Objective:     Vitals:   11/18/22 0931  BP: 118/80  Pulse: 75  SpO2: 98%  Weight: 198 lb (89.8 kg)  Height: 5\' 10"  (1.778 m)      Body mass index is 28.41 kg/m.    Physical Exam:    Gen: Appears well, nad, nontoxic and pleasant Psych: Alert and oriented, appropriate mood and affect Neuro: sensation intact, strength is 5/5 with df/pf/inv/ev, muscle tone wnl Skin: no susupicious lesions or rashes  Right foot/ankle: no deformity, no swelling or effusion Callus formation over tibial side first toe distal phalanx that correlates with TTP NTTP over fibular head, lat mal, medial mal, achilles, navicular, base of 5th, ATFL, CFL, deltoid, calcaneous or midfoot Foot ROM DF 30, PF 45, inv/ev intact Negative ant drawer, talar tilt, rotation test, squeeze test. Neg thompson No pain with resisted inversion or eversion    Electronically signed by:  D.[facility] 9:53 AM 11/18/22

## 2023-02-07 NOTE — Progress Notes (Signed)
02/09/2023 Xavier Cochran [id number] [birth year]  Referring provider: Biagio Borg, MD Primary GI doctor: Dr. Carlean Purl  ASSESSMENT AND PLAN:   Prolapsed internal hemorrhoids, grade 2 -Sitz baths, increase fiber, increase water -Hydrocortisone supp given and external cream sent in.  -We discussed hemorrhoid banding here in the office for internal hemorrhoids, will follow up with Dr. Carlean Purl for evaluation in the office and possible banding   Constipation, unspecified constipation type with Occ AB pain Check CBC, ESR Possible CIC versus IBS-C - Increase fiber/ water intake, decrease caffeine, increase activity level. -Will add on Miralax daily and Benefiber - linzess samples given if miralax does not help   Patient Care Team: Biagio Borg, MD as PCP - General (Internal Medicine)  HISTORY OF PRESENT ILLNESS: 32 y.o. male with a past medical history of allergic rhinitis and others listed below presents for evaluation of hemorrhoids.   04/06/2021 office visit with Dr. Carlean Purl for hemorrhoid inflammation.  A tiny firm mobile nodule left anterior.  Anoscopy showed grade 2 internal hemorrhoids in all positions treated conservatively with fiber, liberal fluid intake.  Consider banding if not improving.  States he has hard time passing stools at times, will have AB pain, and hard stools.  Has BM every day or 2 days.  When he has harder stools will have worsening of his hemorrhoids. Rare nocturnal symptoms if he has not had BM for several. Last night had 2 Bm's with AB pain, felt he needed to have BM, rectal pressure, then soft/loose stools.  Has been using preparation Ph hemorrhoids.  Has taken colon cleanse and metamucil but nothing regular. BRB on TP once.  No weight loss. No fever, chills.  No family history of colon cancer/rectal cancer.   He  reports that he has never smoked. He has never used smokeless tobacco. He reports current alcohol use. He reports that he does not use  drugs.  RELEVANT LABS AND IMAGING: CBC    Component Value Date/Time   WBC 3.2 (L) 06/01/2021 1330   RBC 5.51 06/01/2021 1330   HGB 15.0 06/01/2021 1330   HCT 45.9 06/01/2021 1330   PLT 256 06/01/2021 1330   MCV 83.3 06/01/2021 1330   MCH 27.2 06/01/2021 1330   MCHC 32.7 06/01/2021 1330   RDW 13.2 06/01/2021 1330   LYMPHSABS 1.2 10/30/2020 1125   MONOABS 0.4 10/30/2020 1125   EOSABS 0.1 10/30/2020 1125   BASOSABS 0.0 10/30/2020 1125     CMP     Component Value Date/Time   NA 135 06/01/2021 1330   K 3.3 (L) 06/01/2021 1330   CL 102 06/01/2021 1330   CO2 25 06/01/2021 1330   GLUCOSE 95 06/01/2021 1330   BUN 10 06/01/2021 1330   CREATININE 1.08 06/01/2021 1330   CALCIUM 9.2 06/01/2021 1330   PROT 7.5 10/30/2020 1125   ALBUMIN 4.7 10/30/2020 1125   AST 16 10/30/2020 1125   ALT 24 10/30/2020 1125   ALKPHOS 55 10/30/2020 1125   BILITOT 0.6 10/30/2020 1125   GFRNONAA >60 06/01/2021 1330      Latest Ref Rng & Units 10/30/2020   11:25 AM 06/14/2019    9:08 AM 10/05/2018    3:20 PM  Hepatic Function  Total Protein 6.0 - 8.3 g/dL 7.5  7.1  8.1   Albumin 3.5 - 5.2 g/dL 4.7  4.8  5.0   AST 0 - 37 U/L '16  14  22   '$ ALT 0 - 53 U/L 24  18  33   Alk Phosphatase 39 - 117 U/L 55  49  45   Total Bilirubin 0.2 - 1.2 mg/dL 0.6  0.7  1.0   Bilirubin, Direct 0.0 - 0.3 mg/dL 0.1         Current Medications:        Current Outpatient Medications (Other):    hydrocortisone (ANUSOL-HC) 2.5 % rectal cream, Place 1 Application rectally 2 (two) times daily.   hydrocortisone (ANUSOL-HC) 25 MG suppository, Place 1 suppository (25 mg total) rectally 2 (two) times daily.   OVER THE COUNTER MEDICATION, Sleep gummies as needed   Medical History:  Past Medical History:  Diagnosis Date   Allergic rhinitis, cause unspecified    Hemorrhoids    Allergies:  Allergies  Allergen Reactions   Doxycycline Nausea Only     Surgical History:  He  has a past surgical history that includes  Shoulder arthroscopy with labral repair (Right, 06/01/2021). Family History:  His family history includes Lupus in his paternal grandmother.  REVIEW OF SYSTEMS  : All other systems reviewed and negative except where noted in the History of Present Illness.  PHYSICAL EXAM: BP 120/70   Pulse 88   Ht '5\' 10"'$  (1.778 m)   Wt 195 lb (88.5 kg)   BMI 27.98 kg/m  General Appearance: Well nourished, in no apparent distress. Head:   Normocephalic and atraumatic. Eyes:  sclerae anicteric,conjunctive pink  Respiratory: Respiratory effort normal, BS equal bilaterally without rales, rhonchi, wheezing. Cardio: RRR with no MRGs. Peripheral pulses intact.  Abdomen: Soft,  Non-distended ,active bowel sounds. No tenderness .[name] No masses. Rectal: Normal external rectal exam able to push in small prolapsed hemorrhoids, normal rectal tone, rather large appreciated internal hemorrhoids, non-tender, no masses,  scant to no stool, hemoccult Negative Musculoskeletal: Full ROM, Normal gait. Without edema. Skin:  Dry and intact without significant lesions or rashes Neuro: Alert and  oriented x4;  No focal deficits. Psych:  Cooperative. Normal mood and affect.    Vladimir Crofts, PA-C 3:36 PM

## 2023-02-09 ENCOUNTER — Ambulatory Visit (INDEPENDENT_AMBULATORY_CARE_PROVIDER_SITE_OTHER): Payer: BC Managed Care – PPO | Admitting: Physician Assistant

## 2023-02-09 ENCOUNTER — Encounter: Payer: Self-pay | Admitting: Physician Assistant

## 2023-02-09 ENCOUNTER — Other Ambulatory Visit (INDEPENDENT_AMBULATORY_CARE_PROVIDER_SITE_OTHER): Payer: BC Managed Care – PPO

## 2023-02-09 VITALS — BP 120/70 | HR 88 | Ht 70.0 in | Wt 195.0 lb

## 2023-02-09 DIAGNOSIS — R1084 Generalized abdominal pain: Secondary | ICD-10-CM | POA: Diagnosis not present

## 2023-02-09 DIAGNOSIS — K641 Second degree hemorrhoids: Secondary | ICD-10-CM

## 2023-02-09 DIAGNOSIS — K5900 Constipation, unspecified: Secondary | ICD-10-CM

## 2023-02-09 LAB — CBC WITH DIFFERENTIAL/PLATELET
Basophils Absolute: 0 10*3/uL (ref 0.0–0.1)
Basophils Relative: 1.5 % (ref 0.0–3.0)
Eosinophils Absolute: 0.1 10*3/uL (ref 0.0–0.7)
Eosinophils Relative: 2.7 % (ref 0.0–5.0)
HCT: 45.9 % (ref 39.0–52.0)
Hemoglobin: 15.7 g/dL (ref 13.0–17.0)
Lymphocytes Relative: 46.7 % — ABNORMAL HIGH (ref 12.0–46.0)
Lymphs Abs: 1.6 10*3/uL (ref 0.7–4.0)
MCHC: 34.2 g/dL (ref 30.0–36.0)
MCV: 80.5 fl (ref 78.0–100.0)
Monocytes Absolute: 0.5 10*3/uL (ref 0.1–1.0)
Monocytes Relative: 14.1 % — ABNORMAL HIGH (ref 3.0–12.0)
Neutro Abs: 1.2 10*3/uL — ABNORMAL LOW (ref 1.4–7.7)
Neutrophils Relative %: 35 % — ABNORMAL LOW (ref 43.0–77.0)
Platelets: 257 10*3/uL (ref 150.0–400.0)
RBC: 5.7 Mil/uL (ref 4.22–5.81)
RDW: 14.1 % (ref 11.5–15.5)
WBC: 3.3 10*3/uL — ABNORMAL LOW (ref 4.0–10.5)

## 2023-02-09 LAB — SEDIMENTATION RATE: Sed Rate: 9 mm/hr (ref 0–15)

## 2023-02-09 MED ORDER — LINACLOTIDE 72 MCG PO CAPS: ORAL_CAPSULE | ORAL | 0 refills | Status: DC

## 2023-02-09 MED ORDER — HYDROCORTISONE (PERIANAL) 2.5 % EX CREA: 1.0000 | TOPICAL_CREAM | Freq: Two times a day (BID) | CUTANEOUS | 2 refills | Status: DC

## 2023-02-09 MED ORDER — HYDROCORTISONE ACETATE 25 MG RE SUPP: 25.0000 mg | Freq: Two times a day (BID) | RECTAL | 0 refills | Status: DC

## 2023-02-09 NOTE — Patient Instructions (Addendum)
_______________________________________________________  If your blood pressure at your visit was 140/90 or greater, please contact your primary care physician to follow up on this.  _______________________________________________________  If you are age 31 or older, your body mass index should be between 23-30. Your Body mass index is 27.98 kg/m. If this is out of the aforementioned range listed, please consider follow up with your Primary Care Provider.  If you are age 31 or younger, your body mass index should be between 19-25. Your Body mass index is 27.98 kg/m. If this is out of the aformentioned range listed, please consider follow up with your Primary Care Provider.   ________________________________________________________  The [place] [facility] providers would like to encourage you to use [facility] to communicate with providers for non-urgent requests or questions.  Due to long hold times on the telephone, sending your provider a message by [facility] may be a faster and more efficient way to get a response.  Please allow 48 business hours for a response.  Please remember that this is for non-urgent requests.  _______________________________________________________    Your provider has requested that you go to the basement level for lab work before leaving today. Press "B" on the elevator. The lab is located at the first door on the left as you exit the elevator.  Due to recent changes in healthcare laws, you may see the results of your imaging and laboratory studies on MyChart before your provider has had a chance to review them.  We understand that in some cases there may be results that are confusing or concerning to you. Not all laboratory results come back in the same time frame and the provider may be waiting for multiple results in order to interpret others.  Please give [place] 48 hours in order for your provider to thoroughly review all the results before contacting the office for clarification  of your results.   Medication Samples have been provided to the patient.  Drug name: Linzess       Strength: 2mg        Qty: 12  LOT:[id number]          Exp.Date: 12-2024  Dosing instructions: one daily  The patient has been instructed regarding the correct time, dose, and frequency of taking this medication, including desired effects and most common side effects.   Linzess works best when taken once a day every day, on an empty stomach, at least 30 minutes before your first meal of the day.  When Linzess is taken daily as directed:  *Constipation relief is typically felt in about a week *IBS-C patients may begin to experience relief from belly pain and overall abdominal symptoms (pain, discomfort, and bloating) in about 1 week,   with symptoms typically improving over 12 weeks.  Diarrhea may occur in the first 2 weeks -keep taking it.  The diarrhea should go away and you should start having normal, complete, full bowel movements. It may be helpful to start treatment when you can be near the comfort of your own bathroom, such as a weekend.      Please do sitz baths- these can be found at the pharmacy. It is a pEnglish as a second language teacherthat is put in your toliet.  Please increase fiber or add benefiber, increase water and increase acitivity.  Will send in hydrocoritsone suppository, cheapest with GOODRX from [employer], [employer], [employer] or [employer] if your insurance does not pay for it. If the hemorrhoid suppository sent in is too expensive you can do  this over the counter trick.  Apply a pea size amount of over the counter Anusol HC cream to the tip of an over the counter PrepH suppository and insert rectally once every night for at least 7 nights.  If this does not improve there are procedures that can be done.    Miralax is an osmotic laxative.  It only brings more water into the stool.  This is safe to take daily.  Can take up to 17 gram of miralax twice a day.  Mix with juice or  coffee.  Start 1 capful at night for 3-4 days and reassess your response in 3-4 days.  You can increase and decrease the dose based on your response.  Remember, it can take up to 3-4 days to take effect OR for the effects to wear off.   I often pair this with benefiber in the morning to help assure the stool is not too loose.    BUT IF THE MIRALAX CAUSES GAS OR YOU DO NOT LIKE IT TRY LINZESS SAMPLES 72 MCG Linzess  *IBS-C patients may begin to experience relief from belly pain and overall abdominal symptoms (pain, discomfort, and bloating) in about 1 week,  with symptoms typically improving over 12 weeks.  Take at least 30 minutes before the first meal of the day on an empty stomach You can have a loose stool if you eat a high-fat breakfast. Give it at least 7 days, may have more bowel movements during that time.   The diarrhea should go away and you should start having normal, complete, full bowel movements.  It may be helpful to start treatment when you can be near the comfort of your own bathroom, such as a weekend.  After you are out we can send in a prescription if you did well, there is a prescription card  About Hemorrhoids  Hemorrhoids are swollen veins in the lower rectum and anus.  Also called piles, hemorrhoids are a common problem.  Hemorrhoids may be internal (inside the rectum) or external (around the anus).  Internal Hemorrhoids  Internal hemorrhoids are often painless, but they rarely cause bleeding.  The internal veins may stretch and fall down (prolapse) through the anus to the outside of the body.  The veins may then become irritated and painful.  External Hemorrhoids  External hemorrhoids can be easily seen or felt around the anal opening.  They are under the skin around the anus.  When the swollen veins are scratched or broken by straining, rubbing or wiping they sometimes bleed.  How Hemorrhoids Occur  Veins in the rectum and around the anus tend to swell  under pressure.  Hemorrhoids can result from increased pressure in the veins of your anus or rectum.  Some sources of pressure are:  Straining to have a bowel movement because of constipation Waiting too long to have a bowel movement Coughing and sneezing often Sitting for extended periods of time, including on the toilet Diarrhea Obesity Trauma or injury to the anus Some liver diseases Stress Family history of hemorrhoids Pregnancy  Pregnant women should try to avoid becoming constipated, because they are more likely to have hemorrhoids during pregnancy.  In the last trimester of pregnancy, the enlarged uterus may press on blood vessels and causes hemorrhoids.  In addition, the strain of childbirth sometimes causes hemorrhoids after the birth.  Symptoms of Hemorrhoids  Some symptoms of hemorrhoids include: Swelling and/or a tender lump around the anus Itching, mild burning and bleeding around  the anus Painful bowel movements with or without constipation Bright red blood covering the stool, on toilet paper or in the toilet bowel.   Symptoms usually go away within a few days.  Always talk to your doctor about any bleeding to make sure it is not from some other causes.  Diagnosing and Treating Hemorrhoids  Diagnosis is made by an examination by your healthcare provider.  Special test can be performed by your doctor.    Most cases of hemorrhoids can be treated with: High-fiber diet: Eat more high-fiber foods, which help prevent constipation.  Ask for more detailed fiber information on types and sources of fiber from your healthcare provider. Fluids: Drink plenty of water.  This helps soften bowel movements so they are easier to pass. Sitz baths and cold packs: Sitting in lukewarm water two or three times a day for 15 minutes cleases the anal area and may relieve discomfort.  If the water is too hot, swelling around the anus will get worse.  Placing a cloth-covered ice pack on the anus  for ten minutes four times a day can also help reduce selling.  Gently pushing a prolapsed hemorrhoid back inside after the bath or ice pack can be helpful. Medications: For mild discomfort, your healthcare provider may suggest over-the-counter pain medication or prescribe a cream or ointment for topical use.  The cream may contain witch hazel, zinc oxide or petroleum jelly.  Medicated suppositories are also a treatment option.  Always consult your doctor before applying medications or creams. Procedures and surgeries: There are also a number of procedures and surgeries to shrink or remove hemorrhoids in more serious cases.  Talk to your physician about these options.  You can often prevent hemorrhoids or keep them from becoming worse by maintaining a healthy lifestyle.  Eat a fiber-rich diet of fruits, vegetables and whole grains.  Also, drink plenty of water and exercise regularly.   2007, [employer] Doc.30

## 2023-02-28 ENCOUNTER — Ambulatory Visit (INDEPENDENT_AMBULATORY_CARE_PROVIDER_SITE_OTHER): Payer: BC Managed Care – PPO | Admitting: Orthopedic Surgery

## 2023-02-28 ENCOUNTER — Encounter: Payer: Self-pay | Admitting: Orthopedic Surgery

## 2023-02-28 ENCOUNTER — Other Ambulatory Visit (INDEPENDENT_AMBULATORY_CARE_PROVIDER_SITE_OTHER): Payer: BC Managed Care – PPO

## 2023-02-28 ENCOUNTER — Other Ambulatory Visit: Payer: Self-pay

## 2023-02-28 DIAGNOSIS — M25511 Pain in right shoulder: Secondary | ICD-10-CM

## 2023-02-28 DIAGNOSIS — M659 Synovitis and tenosynovitis, unspecified: Secondary | ICD-10-CM

## 2023-02-28 MED ORDER — LIDOCAINE HCL 1 % IJ SOLN
5.0000 mL | INTRAMUSCULAR | Status: AC | PRN
Start: 2023-02-28 — End: 2023-02-28
  Administered 2023-02-28: 5 mL

## 2023-02-28 MED ORDER — BUPIVACAINE HCL 0.5 % IJ SOLN
9.0000 mL | INTRAMUSCULAR | Status: AC | PRN
Start: 2023-02-28 — End: 2023-02-28
  Administered 2023-02-28: 9 mL via INTRA_ARTICULAR

## 2023-02-28 MED ORDER — DICLOFENAC SODIUM 75 MG PO TBEC: DELAYED_RELEASE_TABLET | ORAL | 0 refills | Status: DC

## 2023-02-28 MED ORDER — METHYLPREDNISOLONE ACETATE 40 MG/ML IJ SUSP
40.0000 mg | INTRAMUSCULAR | Status: AC | PRN
Start: 2023-02-28 — End: 2023-02-28
  Administered 2023-02-28: 40 mg via INTRA_ARTICULAR

## 2023-02-28 NOTE — Progress Notes (Signed)
Office Visit Note   Patient: Xavier Cochran.           Date of Birth: [birth year]           MRN: [id number] Visit Date: 02/28/2023 Requested by: Biagio Borg, MD [place],  [place] [id number] PCP: Biagio Borg, MD  Subjective: Chief Complaint  Patient presents with   Right Shoulder - Pain    HPI: Xavier Cochran. is a 32 y.o. male who presents to the office reporting 3-week history of right shoulder pain.  No recurrent injury.  Reports some soreness and popping.  Pain wakes him from sleep at night.  Takes Advil without much relief.  Does describe pain in the shoulder but his shoulder does feel stable.  Once his shoulder pops he states it feels better.  No pain radiating to the biceps region and he denies any neck symptoms.  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  Has not done any sports since his surgery 06/01/2021.  Describes with anterior and posterior pain..                ROS: All systems reviewed are negative as they relate to the chief complaint within the history of present illness.  Patient denies fevers or chills.  Assessment & Plan: Visit Diagnoses:  1. Right shoulder pain, unspecified chronicity     Plan: Impression is right shoulder pain with very stable shoulder and good range of motion.  Does have a little bit of popping in there but that is fairly mild.  Cuff strength is good.  I would favor ultrasound-guided injection into the glenohumeral joint with Voltaren taper over the next 2 weeks.  MRI scanning will be next and he will call if his symptoms or not improved within 4 to 6 weeks.  Follow-Up Instructions: No follow-ups on file.   Orders:  Orders Placed This Encounter  Procedures   XR Shoulder Right   US Guided Needle Placement - No Linked Charges   Meds ordered this encounter  Medications   diclofenac (VOLTAREN) 75 MG EC tablet    Sig: 1 po bid x 7 days then q d x 7 days then q d prn    Dispense:  35 tablet    Refill:  0       Procedures: Large Joint Inj: R glenohumeral on 02/28/2023 6:32 PM Indications: diagnostic evaluation and pain Details: 18 G 1.5 in needle, ultrasound-guided posterior approach  Arthrogram: No  Medications: 9 mL bupivacaine 0.5 %; 40 mg methylPREDNISolone acetate 40 MG/ML; 5 mL lidocaine 1 % Outcome: tolerated well, no immediate complications Procedure, treatment alternatives, risks and benefits explained, specific risks discussed. Consent was given by the patient. Immediately prior to procedure a time out was called to verify the correct patient, procedure, equipment, support staff and site/side marked as required. Patient was prepped and draped in the usual sterile fashion.       Clinical Data: No additional findings.  Objective: Vital Signs: There were no vitals taken for this visit.  Physical Exam:  Constitutional: Patient appears well-developed HEENT:  Head: Normocephalic Eyes:EOM are normal Neck: Normal range of motion Cardiovascular: Normal rate Pulmonary/chest: Effort normal Neurologic: Patient is alert Skin: Skin is warm Psychiatric: Patient has normal mood and affect  Ortho Exam: Ortho exam demonstrates range of motion on the right of 45/95/165.  Has excellent rotator cuff strength infraspinatus supraspinatus and subscap muscle testing with very  good stability of the shoulder to anterior and posterior testing while sitting upright.  Fairly minimal crepitus with with labral load testing.  Negative O'Brien's testing.  No biceps tenderness.  Does have a little bit of tenderness in the bicipital groove on that right-hand side.  Small amount of keloid formation around the portals.  Specialty Comments:  No specialty comments available.  Imaging: XR Shoulder Right  Result Date: 02/28/2023 AP axillary outlet radiographs right shoulder reviewed.  Shoulder is located.  Acromiohumeral distance intact.  No glenohumeral joint or AC joint arthritis.  No acute fracture.   Visualized lung fields clear.  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   MCL sprain of right knee 08/15/2019   Acute prostatitis 03/06/2019   Hemorrhoid 03/06/2019   Acute shoulder bursitis, right 02/27/2019   Ganglion cyst of foot 02/27/2019   Asthma 04/28/2018   Constipation 07/08/2016   Right knee pain 05/29/2015   Shoulder instability, right 01/17/2015   Right shoulder pain 02/03/2014   Proteinuria 11/15/2013   Right inguinal hernia 09/27/2013   DOE (dyspnea on exertion) 06/10/2013   Venereal disease, unspecified 11/02/2012   Allergic rhinitis    External hemorrhoid, thrombosed 10/24/2012   Osgood-Schlatter's disease 10/28/2011   Encounter for well adult exam with abnormal findings 10/22/2011   Past Medical History:  Diagnosis Date   Allergic rhinitis, cause unspecified    Hemorrhoids     Family History  Problem Relation Age of Onset   Lupus Paternal Grandmother     Past Surgical History:  Procedure Laterality Date   SHOULDER ARTHROSCOPY WITH LABRAL REPAIR Right 06/01/2021   Procedure: RIGHT SHOULDER ARTHROSCOPY WITH LABRAL REPAIR INFERIOR;  Surgeon: Meredith Pel, MD;  Location: [facility];  Service: Orthopedics;  Laterality: Right;   Social History   Occupational History   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

## 2023-03-03 ENCOUNTER — Ambulatory Visit: Payer: BC Managed Care – PPO | Admitting: Orthopedic Surgery

## 2023-03-08 IMAGING — MR MR SHOULDER*R* W/CM
5 series · 40 of 40 positions shown · IV contrast (agent unspecified)
Comparison: Right shoulder x-rays dated April 16, 2021.

CLINICAL DATA: Right shoulder pain and clicking for the past 2
months. Remote history of prior dislocation. No prior surgery.

EXAM:
MR ARTHROGRAM OF THE RIGHT SHOULDER
TECHNIQUE: Multiplanar, multisequence MR imaging of the right shoulder was
performed following the administration of intra-articular contrast.
CONTRAST:  See Injection Documentation.

[Series 2: T1 fat-sat · axial · 4.0mm · 0.50mm/px · z∈[-19,+81]mm · 9 of 22 slices shown (1 of 3)]
[im 1/22]
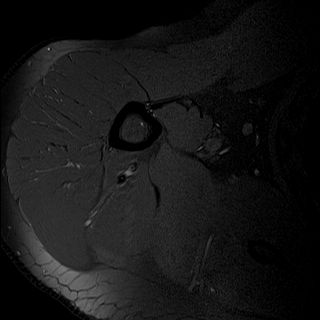
[im 3/22]
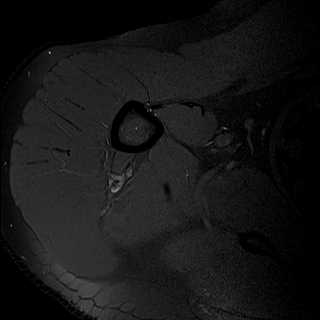
[im 6/22]
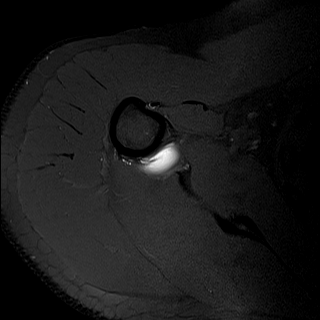
[im 8/22]
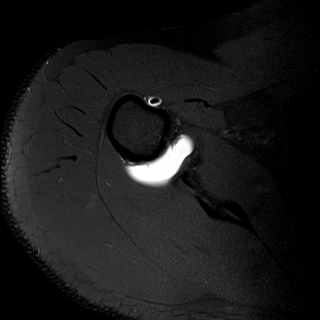
[im 11/22]
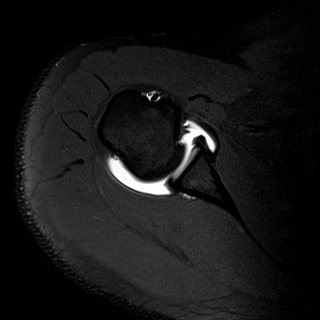
[im 14/22]
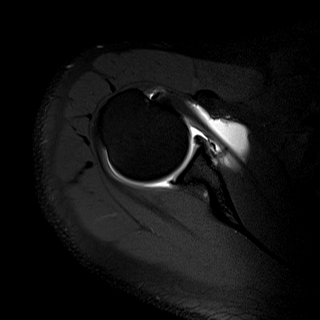
[im 16/22]
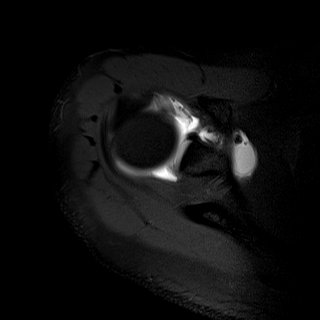
[im 19/22]
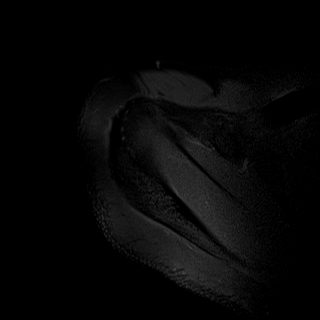
[im 22/22]
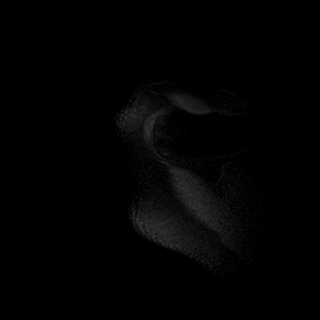

[Series 3: T1 fat-sat · coronal · 3.0mm · 0.50mm/px · 7 of 20 slices shown (2 of 3)]
[im 1/20]
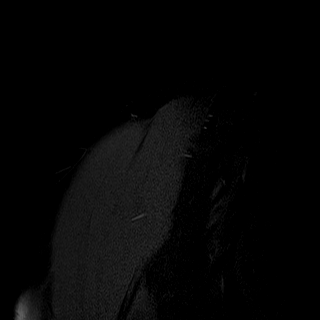
[im 4/20]
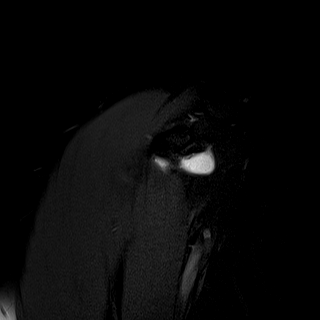
[im 7/20]
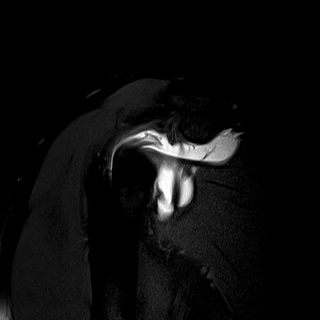
[im 10/20]
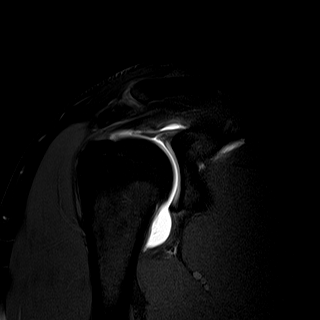
[im 13/20]
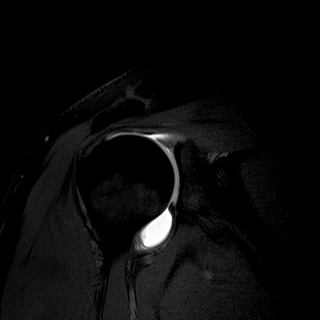
[im 16/20]
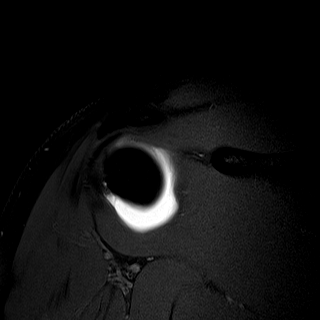
[im 20/20]
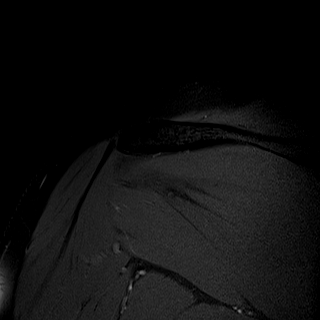

[Series 4: T2 fat-sat · coronal · 3.0mm · 0.62mm/px · 7 of 20 slices shown (1 of 2)]
[im 1/20]
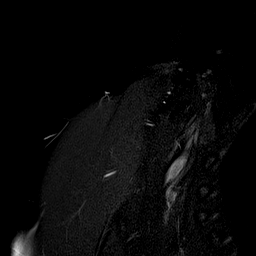
[im 4/20]
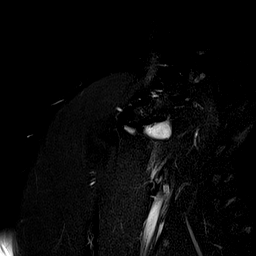
[im 7/20]
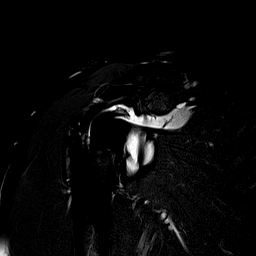
[im 10/20]
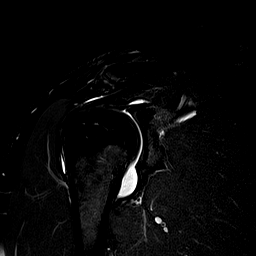
[im 13/20]
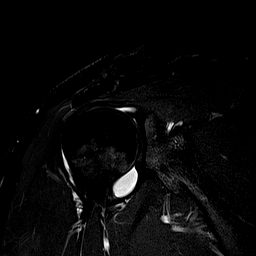
[im 16/20]
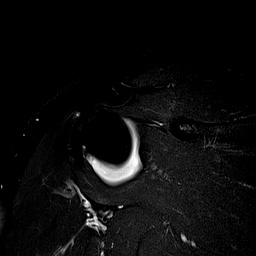
[im 20/20]
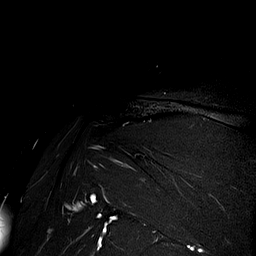

[Series 5: T1 fat-sat · coronal · non-contrast · 3.0mm · 0.62mm/px · 7 of 20 slices shown (3 of 3)]
[im 1/20]
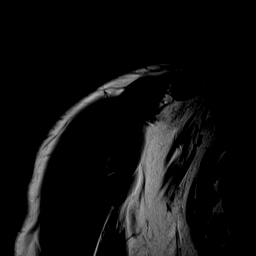
[im 4/20]
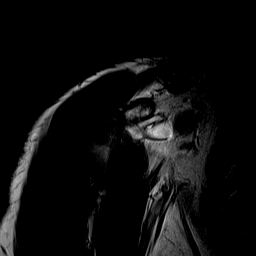
[im 7/20]
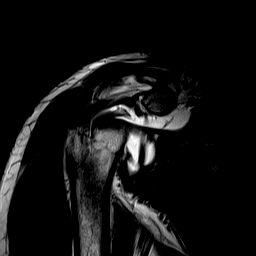
[im 10/20]
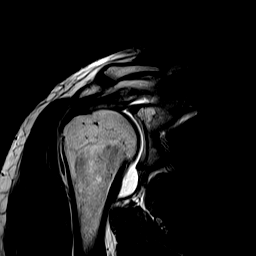
[im 13/20]
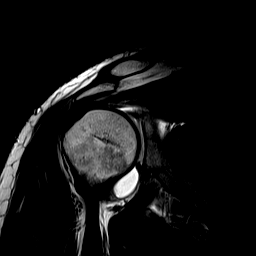
[im 16/20]
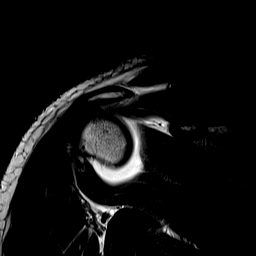
[im 20/20]
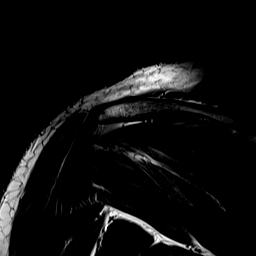

[Series 6: T2 fat-sat · oblique · 3.0mm · 0.62mm/px · 10 of 29 slices shown (2 of 2)]
[im 1/29]
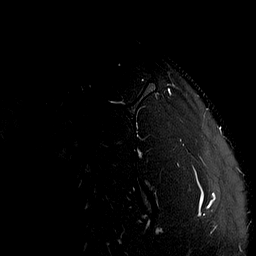
[im 4/29]
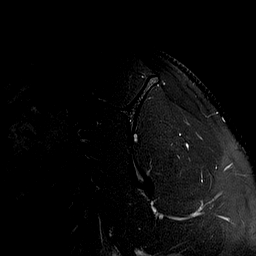
[im 7/29]
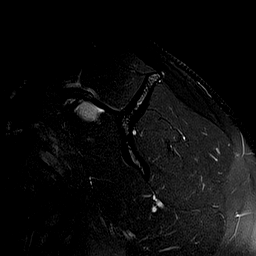
[im 10/29]
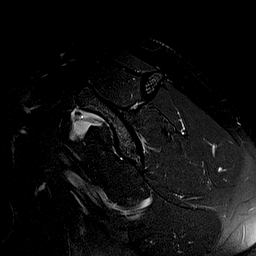
[im 13/29]
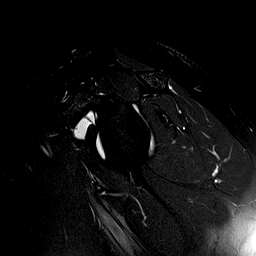
[im 16/29]
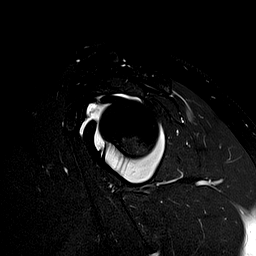
[im 19/29]
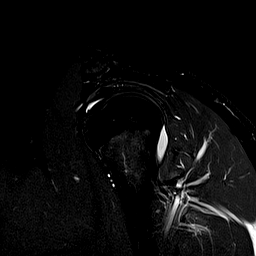
[im 22/29]
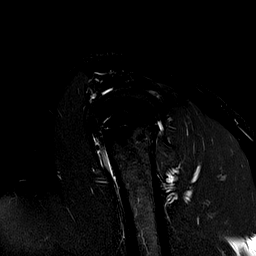
[im 25/29]
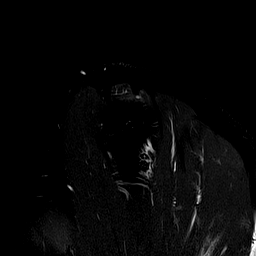
[im 29/29]
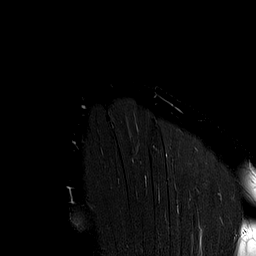

[40 of 40 positions shown; findings below may reference images not displayed]

FINDINGS: Rotator cuff:  Intact without significant tendinosis.

Muscles:  No focal muscular atrophy or edema.

Biceps long head:  Intact and normally positioned.

Acromioclavicular Joint: The acromion is type II. Normal
acromioclavicular joint. Small amount of fluid but no contrast in
the subacromial/subdeltoid bursa.

Glenohumeral Joint: Distended with intra-articular contrast. No
chondral defect.

Labrum: Small superior labral tear at the biceps labral anchor
(series 3, image 10).

Bones: No acute fracture or dislocation. No suspicious bone lesion.

Other: None.
IMPRESSION: 1. Small superior labral tear at the biceps labral anchor.
2. Intact rotator cuff.
3. Mild subacromial/subdeltoid bursitis.

## 2023-03-31 ENCOUNTER — Encounter: Payer: Self-pay | Admitting: Family Medicine

## 2023-03-31 ENCOUNTER — Ambulatory Visit (INDEPENDENT_AMBULATORY_CARE_PROVIDER_SITE_OTHER): Payer: BC Managed Care – PPO | Admitting: Family Medicine

## 2023-03-31 ENCOUNTER — Other Ambulatory Visit: Payer: Self-pay | Admitting: Orthopedic Surgery

## 2023-03-31 VITALS — BP 118/82 | HR 64 | Temp 98.0°F | Ht 70.0 in | Wt 194.0 lb

## 2023-03-31 DIAGNOSIS — J3489 Other specified disorders of nose and nasal sinuses: Secondary | ICD-10-CM

## 2023-03-31 DIAGNOSIS — J309 Allergic rhinitis, unspecified: Secondary | ICD-10-CM

## 2023-03-31 DIAGNOSIS — J4530 Mild persistent asthma, uncomplicated: Secondary | ICD-10-CM | POA: Diagnosis not present

## 2023-03-31 MED ORDER — METHYLPREDNISOLONE ACETATE 80 MG/ML IJ SUSP
80.0000 mg | Freq: Once | INTRAMUSCULAR | Status: AC
Start: 2023-03-31 — End: 2023-03-31
  Administered 2023-03-31: 80 mg via INTRAMUSCULAR

## 2023-03-31 NOTE — Patient Instructions (Signed)
I recommend starting once daily Xyzal allergy medication and Flonase nasal spray.   Stay well hydrated.

## 2023-03-31 NOTE — Progress Notes (Signed)
Subjective:  Xavier Cochran. is a 32 y.o. male who presents for a 2 wk hx of nasal congestion, itchy, watery eyes, rhinorrhea, mouth itching and intermittent ST. Mild cough.  Underlying asthma.   Denies fever, chills, dizziness, chest pain, palpitations, shortness of breath, abdominal pain, N/V/D.   No other aggravating or relieving factors.  No other c/o.  ROS as in subjective.   Objective: Vitals:   03/31/23 1114  BP: 118/82  Pulse: 64  Temp: 98 F (36.7 C)  SpO2: 98%    General appearance: Alert, WD/WN, no distress, mildly ill appearing                             Skin: warm, no rash                           Head: no sinus tenderness                            Eyes: conjunctiva normal, corneas clear, PERRLA                            Ears: pearly TMs, external ear canals with erythema                          Nose: septum midline, turbinates swollen, with erythema and clear discharge             Mouth/throat: MMM, tongue normal, mod pharyngeal erythema and mild edema, no exudate                            Neck: supple, no adenopathy, no thyromegaly, nontender                          Heart: RRR, normal S1, S2, no murmurs                         Lungs: CTA bilaterally, no wheezes, rales, or rhonchi      Assessment: Allergic rhinitis, unspecified seasonality, unspecified trigger - Plan: methylPREDNISolone acetate (DEPO-MEDROL) injection 80 mg  Sinus pressure - Plan: methylPREDNISolone acetate (DEPO-MEDROL) injection 80 mg  Mild persistent asthma without complication - Plan: methylPREDNISolone acetate (DEPO-MEDROL) injection 80 mg   Plan: Allergic rhinitis mod-sev course, Depo-Medrol 80 mg IM injection given in office.  Recommend starting Xyzal over-the-counter and Flonase. Nasal saline spray for congestion.  Follow-up as needed.

## 2023-04-11 ENCOUNTER — Ambulatory Visit: Payer: BC Managed Care – PPO | Admitting: Internal Medicine

## 2023-06-19 ENCOUNTER — Ambulatory Visit (INDEPENDENT_AMBULATORY_CARE_PROVIDER_SITE_OTHER): Payer: BC Managed Care – PPO | Admitting: Physician Assistant

## 2023-06-19 ENCOUNTER — Encounter: Payer: Self-pay | Admitting: Physician Assistant

## 2023-06-19 VITALS — BP 108/72 | HR 77 | Ht 70.0 in | Wt 198.2 lb

## 2023-06-19 DIAGNOSIS — R1084 Generalized abdominal pain: Secondary | ICD-10-CM

## 2023-06-19 DIAGNOSIS — K5900 Constipation, unspecified: Secondary | ICD-10-CM

## 2023-06-19 DIAGNOSIS — K641 Second degree hemorrhoids: Secondary | ICD-10-CM

## 2023-06-19 NOTE — Patient Instructions (Addendum)
We have scheduled you a hem banding with Dr Leone Payor on 08/16/2023 at 2:50pm   Please do sitz baths- these can be found at the pharmacy. It is a Chief Operating Officer that is put in your toliet.  Please increase fiber or add benefiber, increase water and increase acitivity.  Will send in hydrocoritsone suppository, cheapest with GOODRX from [employer], [employer], [employer] or [employer] if your insurance does not pay for it. If the hemorrhoid suppository sent in is too expensive you can do this over the counter trick.  Apply a pea size amount of over the counter Anusol HC cream to the tip of an over the counter PrepH suppository and insert rectally once every night for at least 7 nights.  If this does not improve there are procedures that can be done.   Miralax is an osmotic laxative.  It only brings more water into the stool.  This is safe to take daily.  Can take up to 17 gram of miralax twice a day.  Mix with juice or coffee.  Start 1 capful at night for 3-4 days and reassess your response in 3-4 days.  You can increase and decrease the dose based on your response.  Remember, it can take up to 3-4 days to take effect OR for the effects to wear off.   I often pair this with benefiber in the morning to help assure the stool is not too loose.    About Hemorrhoids  Hemorrhoids are swollen veins in the lower rectum and anus.  Also called piles, hemorrhoids are a common problem.  Hemorrhoids may be internal (inside the rectum) or external (around the anus).  Internal Hemorrhoids  Internal hemorrhoids are often painless, but they rarely cause bleeding.  The internal veins may stretch and fall down (prolapse) through the anus to the outside of the body.  The veins may then become irritated and painful.  External Hemorrhoids  External hemorrhoids can be easily seen or felt around the anal opening.  They are under the skin around the anus.  When the swollen veins are scratched or broken by straining,  rubbing or wiping they sometimes bleed.  How Hemorrhoids Occur  Veins in the rectum and around the anus tend to swell under pressure.  Hemorrhoids can result from increased pressure in the veins of your anus or rectum.  Some sources of pressure are:  Straining to have a bowel movement because of constipation Waiting too long to have a bowel movement Coughing and sneezing often Sitting for extended periods of time, including on the toilet Diarrhea Obesity Trauma or injury to the anus Some liver diseases Stress Family history of hemorrhoids Pregnancy  Pregnant women should try to avoid becoming constipated, because they are more likely to have hemorrhoids during pregnancy.  In the last trimester of pregnancy, the enlarged uterus may press on blood vessels and causes hemorrhoids.  In addition, the strain of childbirth sometimes causes hemorrhoids after the birth.  Symptoms of Hemorrhoids  Some symptoms of hemorrhoids include: Swelling and/or a tender lump around the anus Itching, mild burning and bleeding around the anus Painful bowel movements with or without constipation Bright red blood covering the stool, on toilet paper or in the toilet bowel.   Symptoms usually go away within a few days.  Always talk to your doctor about any bleeding to make sure it is not from some other causes.  Diagnosing and Treating Hemorrhoids  Diagnosis is made by an examination by your healthcare provider.  Special test can be performed by your doctor.    Most cases of hemorrhoids can be treated with: High-fiber diet: Eat more high-fiber foods, which help prevent constipation.  Ask for more detailed fiber information on types and sources of fiber from your healthcare provider. Fluids: Drink plenty of water.  This helps soften bowel movements so they are easier to pass. Sitz baths and cold packs: Sitting in lukewarm water two or three times a day for 15 minutes cleases the anal area and may relieve  discomfort.  If the water is too hot, swelling around the anus will get worse.  Placing a cloth-covered ice pack on the anus for ten minutes four times a day can also help reduce selling.  Gently pushing a prolapsed hemorrhoid back inside after the bath or ice pack can be helpful. Medications: For mild discomfort, your healthcare provider may suggest over-the-counter pain medication or prescribe a cream or ointment for topical use.  The cream may contain witch hazel, zinc oxide or petroleum jelly.  Medicated suppositories are also a treatment option.  Always consult your doctor before applying medications or creams. Procedures and surgeries: There are also a number of procedures and surgeries to shrink or remove hemorrhoids in more serious cases.  Talk to your physician about these options.  You can often prevent hemorrhoids or keep them from becoming worse by maintaining a healthy lifestyle.  Eat a fiber-rich diet of fruits, vegetables and whole grains.  Also, drink plenty of water and exercise regularly.   2007, [employer] Doc.30  _______________________________________________________  If your blood pressure at your visit was 140/90 or greater, please contact your primary care physician to follow up on this.  _______________________________________________________  If you are age 57 or older, your body mass index should be between 23-30. Your Body mass index is 28.45 kg/m. If this is out of the aforementioned range listed, please consider follow up with your Primary Care Provider.  If you are age 19 or younger, your body mass index should be between 19-25. Your Body mass index is 28.45 kg/m. If this is out of the aformentioned range listed, please consider follow up with your Primary Care Provider.   ________________________________________________________  The [facility] providers would like to encourage you to use [facility] to communicate with providers for non-urgent requests  or questions.  Due to long hold times on the telephone, sending your provider a message by [facility] may be a faster and more efficient way to get a response.  Please allow 48 business hours for a response.  Please remember that this is for non-urgent requests.  _______________________________________________________ It was a pleasure to see you today!  Thank you for trusting me with your gastrointestinal care!

## 2023-06-19 NOTE — Progress Notes (Signed)
06/19/2023 Xavier Cochran [id number] 1990/12/19  Referring provider: [name], MD Primary GI doctor: Dr. Leone Cochran  ASSESSMENT AND PLAN:   Prolapsed internal hemorrhoids, grade 2 -Sitz baths, increase fiber, increase water -We discussed hemorrhoid banding here in the office for internal hemorrhoids, will follow up with Dr. Leone Cochran for evaluation in the office and possible banding   Constipation, unspecified constipation type with Occ AB pain Negative ESR/CRP Possible CIC versus IBS-C - get back on fiber daily - can add miralax - call if any blood in stool or changes in bowel habits   Patient Care Team: Xavier Levins, MD as PCP - General (Internal Medicine)  HISTORY OF PRESENT ILLNESS: 32 y.o. male with a past medical history of allergic rhinitis and others listed below presents for evaluation of hemorrhoids.   04/06/2021 office visit with Dr. Leone Cochran for hemorrhoid inflammation.  A tiny firm mobile nodule left anterior.  Anoscopy showed grade 2 internal hemorrhoids in all positions treated conservatively with fiber, liberal fluid intake.  He was scheduled for banding with Dr. Leone Cochran 04/11/2023 but his daughter was sick so he could not make the appointment.  He states they are doing better, has not been bothering him too much but last week he did have a flare after [employer]. He was on fiber but stopped.  No blood in the stool.  No weight loss. No fever, chills.  Has been using preparation Ph hemorrhoids which helps.  No family history of colon cancer/rectal cancer.   He  reports that he has never smoked. He has never used smokeless tobacco. He reports current alcohol use. He reports that he does not use drugs.  RELEVANT LABS AND IMAGING: CBC    Component Value Date/Time   WBC 3.3 (L) 02/09/2023 1550   RBC 5.70 02/09/2023 1550   HGB 15.7 02/09/2023 1550   HCT 45.9 02/09/2023 1550   PLT 257.0 02/09/2023 1550   MCV 80.5 02/09/2023 1550   MCH 27.2 06/01/2021 1330    MCHC 34.2 02/09/2023 1550   RDW 14.1 02/09/2023 1550   LYMPHSABS 1.6 02/09/2023 1550   MONOABS 0.5 02/09/2023 1550   EOSABS 0.1 02/09/2023 1550   BASOSABS 0.0 02/09/2023 1550     CMP     Component Value Date/Time   NA 135 06/01/2021 1330   K 3.3 (L) 06/01/2021 1330   CL 102 06/01/2021 1330   CO2 25 06/01/2021 1330   GLUCOSE 95 06/01/2021 1330   BUN 10 06/01/2021 1330   CREATININE 1.08 06/01/2021 1330   CALCIUM 9.2 06/01/2021 1330   PROT 7.5 10/30/2020 1125   ALBUMIN 4.7 10/30/2020 1125   AST 16 10/30/2020 1125   ALT 24 10/30/2020 1125   ALKPHOS 55 10/30/2020 1125   BILITOT 0.6 10/30/2020 1125   GFRNONAA >60 06/01/2021 1330      Latest Ref Rng & Units 10/30/2020   11:25 AM 06/14/2019    9:08 AM 10/05/2018    3:20 PM  Hepatic Function  Total Protein 6.0 - 8.3 g/dL 7.5  7.1  8.1   Albumin 3.5 - 5.2 g/dL 4.7  4.8  5.0   AST 0 - 37 U/L 16  14  22    ALT 0 - 53 U/L 24  18  33   Alk Phosphatase 39 - 117 U/L 55  49  45   Total Bilirubin 0.2 - 1.2 mg/dL 0.6  0.7  1.0   Bilirubin, Direct 0.0 - 0.3 mg/dL 0.1  Current Medications:  No current outpatient medications on file.    Medical History:  Past Medical History:  Diagnosis Date   Allergic rhinitis, cause unspecified    Hemorrhoids    Allergies:  Allergies  Allergen Reactions   Doxycycline Nausea Only     Surgical History:  He  has a past surgical history that includes Shoulder arthroscopy with labral repair (Right, 06/01/2021). Family History:  His family history includes Lupus in his paternal grandmother.  REVIEW OF SYSTEMS  : All other systems reviewed and negative except where noted in the History of Present Illness.  PHYSICAL EXAM: BP 108/72   Pulse 77   Ht 5\' 10"  (1.778 m)   Wt 198 lb 4 oz (89.9 kg)   SpO2 97%   BMI 28.45 kg/m  General Appearance: Well nourished, in no apparent distress. Head:   Normocephalic and atraumatic. Eyes:  sclerae anicteric,conjunctive pink  Respiratory:  Respiratory effort normal, BS equal bilaterally without rales, rhonchi, wheezing. Cardio: RRR with no MRGs. Peripheral pulses intact.  Abdomen: Soft,  Non-distended ,active bowel sounds. No tenderness .[name] No masses. Rectal: declines Musculoskeletal: Full ROM, Normal gait. Without edema. Skin:  Dry and intact without significant lesions or rashes Neuro: Alert and  oriented x4;  No focal deficits. Psych:  Cooperative. Normal mood and affect.    [name], PA-C 3:18 PM

## 2023-08-16 ENCOUNTER — Encounter: Payer: Self-pay | Admitting: Internal Medicine

## 2023-08-16 ENCOUNTER — Ambulatory Visit (INDEPENDENT_AMBULATORY_CARE_PROVIDER_SITE_OTHER): Payer: BC Managed Care – PPO | Admitting: Internal Medicine

## 2023-08-16 VITALS — BP 138/80 | HR 96 | Ht 70.0 in | Wt 196.0 lb

## 2023-08-16 DIAGNOSIS — K641 Second degree hemorrhoids: Secondary | ICD-10-CM | POA: Diagnosis not present

## 2023-08-16 NOTE — Progress Notes (Signed)
HEMORRHOID LIGATION  Indications - prolapse sxs  Anoscopy w/ Gr 2 LL and RP internal hemorrhoids gr 1 RA   PROCEDURE NOTE: The patient presents with symptomatic grade 2  hemorrhoids, requesting rubber band ligation of his/her hemorrhoidal disease.  All risks, benefits and alternative forms of therapy were described and informed consent was obtained.   The anorectum was pre-medicated with 0.125% NTG and 5% lidocaine The decision was made to band the LL and RP internal hemorrhoids, and the [facility] System was used to perform band ligation without complication.  Digital anorectal examination was then performed to assure proper positioning of the band, and to adjust the banded tissue as required.  The patient was discharged home without pain or other issues.  Dietary and behavioral recommendations were given and along with follow-up instructions.     The following adjunctive treatments were recommended:  Daily benefiber  The patient will return in November for  follow-up and possible additional banding as required. No complications were encountered and the patient tolerated the procedure well.

## 2023-08-16 NOTE — Patient Instructions (Addendum)
_______________________________________________________  If your blood pressure at your visit was 140/90 or greater, please contact your primary care physician to follow up on this.  _______________________________________________________  If you are age 31 or older, your body mass index should be between 23-30. Your Body mass index is 28.12 kg/m. If this is out of the aforementioned range listed, please consider follow up with your Primary Care Provider.  If you are age 34 or younger, your body mass index should be between 19-25. Your Body mass index is 28.12 kg/m. If this is out of the aformentioned range listed, please consider follow up with your Primary Care Provider.   ________________________________________________________  The [facility] providers would like to encourage you to use [facility] to communicate with providers for non-urgent requests or questions.  Due to long hold times on the telephone, sending your provider a message by [facility] may be a faster and more efficient way to get a response.  Please allow 48 business hours for a response.  Please remember that this is for non-urgent requests.  _______________________________________________________  Please purchase the following medications over the counter and take as directed: Benefiber 1 tablespoon daily. Benefiber is fiber supplement that is found over the counter  A work note has been given to you   You have been scheduled for an appointment with Dr. Leone Payor on 10-03-2023 at 2:50pm . Please arrive 10 minutes early for your appointment.  HEMORRHOID BANDING PROCEDURE    FOLLOW-UP CARE   The procedure you have had should have been relatively painless since the banding of the area involved does not have nerve endings and there is no pain sensation.  The rubber band cuts off the blood supply to the hemorrhoid and the band may fall off as soon as 48 hours after the banding (the band may occasionally be seen in the toilet  bowl following a bowel movement). You may notice a temporary feeling of fullness in the rectum which should respond adequately to plain Tylenol or Motrin.  Following the banding, avoid strenuous exercise that evening and resume full activity the next day.  A sitz bath (soaking in a warm tub) or bidet is soothing, and can be useful for cleansing the area after bowel movements.     To avoid constipation, take two tablespoons of natural wheat bran, natural oat bran, flax, Benefiber or any over the counter fiber supplement and increase your water intake to 7-8 glasses daily.    Unless you have been prescribed anorectal medication, do not put anything inside your rectum for two weeks: No suppositories, enemas, fingers, etc.  Occasionally, you may have more bleeding than usual after the banding procedure.  This is often from the untreated hemorrhoids rather than the treated one.  Don't be concerned if there is a tablespoon or so of blood.  If there is more blood than this, lie flat with your bottom higher than your head and apply an ice pack to the area. If the bleeding does not stop within a half an hour or if you feel faint, call our office at [phone] or go to the emergency room.  Problems are not common; however, if there is a substantial amount of bleeding, severe pain, chills, fever or difficulty passing urine (very rare) or other problems, you should call us at [phone] or report to the nearest emergency room.  Do not stay seated continuously for more than 2-3 hours for a day or two after the procedure.  Tighten your buttock muscles  10-15 times every two hours and take 10-15 deep breaths every 1-2 hours.  Do not spend more than a few minutes on the toilet if you cannot empty your bowel; instead re-visit the toilet at a later time.  It was a pleasure to see you today!  Thank you for trusting me with your gastrointestinal care!

## 2023-10-02 ENCOUNTER — Encounter: Payer: Self-pay | Admitting: Internal Medicine

## 2023-10-03 ENCOUNTER — Encounter: Payer: BC Managed Care – PPO | Admitting: Internal Medicine

## 2023-10-06 ENCOUNTER — Ambulatory Visit (INDEPENDENT_AMBULATORY_CARE_PROVIDER_SITE_OTHER): Payer: BC Managed Care – PPO | Admitting: Internal Medicine

## 2023-10-06 ENCOUNTER — Encounter: Payer: Self-pay | Admitting: Internal Medicine

## 2023-10-06 VITALS — BP 128/74 | HR 67 | Temp 98.2°F | Ht 70.0 in | Wt 198.0 lb

## 2023-10-06 DIAGNOSIS — E538 Deficiency of other specified B group vitamins: Secondary | ICD-10-CM | POA: Diagnosis not present

## 2023-10-06 DIAGNOSIS — R102 Pelvic and perineal pain unspecified side: Secondary | ICD-10-CM | POA: Insufficient documentation

## 2023-10-06 DIAGNOSIS — R339 Retention of urine, unspecified: Secondary | ICD-10-CM | POA: Insufficient documentation

## 2023-10-06 DIAGNOSIS — E7800 Pure hypercholesterolemia, unspecified: Secondary | ICD-10-CM | POA: Diagnosis not present

## 2023-10-06 DIAGNOSIS — J4530 Mild persistent asthma, uncomplicated: Secondary | ICD-10-CM | POA: Diagnosis not present

## 2023-10-06 DIAGNOSIS — J309 Allergic rhinitis, unspecified: Secondary | ICD-10-CM

## 2023-10-06 DIAGNOSIS — Z0001 Encounter for general adult medical examination with abnormal findings: Secondary | ICD-10-CM | POA: Insufficient documentation

## 2023-10-06 LAB — BASIC METABOLIC PANEL
BUN: 14 mg/dL (ref 6–23)
CO2: 30 meq/L (ref 19–32)
Calcium: 9.4 mg/dL (ref 8.4–10.5)
Chloride: 102 meq/L (ref 96–112)
Creatinine, Ser: 1.21 mg/dL (ref 0.40–1.50)
GFR: 79.81 mL/min (ref >60.00)
Glucose, Bld: 107 mg/dL — ABNORMAL HIGH (ref 70–99)
Potassium: 4 meq/L (ref 3.5–5.1)
Sodium: 139 meq/L (ref 135–145)

## 2023-10-06 LAB — URINALYSIS, ROUTINE W REFLEX MICROSCOPIC
Bilirubin Urine: NEGATIVE
Hgb urine dipstick: NEGATIVE
Ketones, ur: NEGATIVE
Leukocytes,Ua: NEGATIVE
Nitrite: NEGATIVE
RBC / HPF: NONE SEEN (ref 0–?)
Specific Gravity, Urine: 1.02 (ref 1.000–1.030)
Total Protein, Urine: NEGATIVE
Urine Glucose: NEGATIVE
Urobilinogen, UA: 1 (ref 0.0–1.0)
WBC, UA: NONE SEEN (ref 0–?)
pH: 7 (ref 5.0–8.0)

## 2023-10-06 LAB — CBC WITH DIFFERENTIAL/PLATELET
Basophils Absolute: 0 10*3/uL (ref 0.0–0.1)
Basophils Relative: 0.4 % (ref 0.0–3.0)
Eosinophils Absolute: 0.1 10*3/uL (ref 0.0–0.7)
Eosinophils Relative: 3 % (ref 0.0–5.0)
HCT: 46.1 % (ref 39.0–52.0)
Hemoglobin: 15.2 g/dL (ref 13.0–17.0)
Lymphocytes Relative: 45.1 % (ref 12.0–46.0)
Lymphs Abs: 1.9 10*3/uL (ref 0.7–4.0)
MCHC: 33 g/dL (ref 30.0–36.0)
MCV: 82.8 fL (ref 78.0–100.0)
Monocytes Absolute: 0.5 10*3/uL (ref 0.1–1.0)
Monocytes Relative: 11.7 % (ref 3.0–12.0)
Neutro Abs: 1.7 10*3/uL (ref 1.4–7.7)
Neutrophils Relative %: 39.8 % — ABNORMAL LOW (ref 43.0–77.0)
Platelets: 275 10*3/uL (ref 150.0–400.0)
RBC: 5.57 Mil/uL (ref 4.22–5.81)
RDW: 13.9 % (ref 11.5–15.5)
WBC: 4.2 10*3/uL (ref 4.0–10.5)

## 2023-10-06 LAB — VITAMIN B12: Vitamin B-12: 252 pg/mL (ref 211–911)

## 2023-10-06 LAB — TSH: TSH: 1.85 u[IU]/mL (ref 0.35–5.50)

## 2023-10-06 LAB — LIPID PANEL
Cholesterol: 124 mg/dL (ref 0–200)
HDL: 40.2 mg/dL (ref >39.00)
LDL Cholesterol: 50 mg/dL (ref 0–99)
NonHDL: 83.86
Total CHOL/HDL Ratio: 3
Triglycerides: 171 mg/dL — ABNORMAL HIGH (ref 0.0–149.0)
VLDL: 34.2 mg/dL (ref 0.0–40.0)

## 2023-10-06 LAB — HEPATIC FUNCTION PANEL
ALT: 24 U/L (ref 0–53)
AST: 18 U/L (ref 0–37)
Albumin: 4.6 g/dL (ref 3.5–5.2)
Alkaline Phosphatase: 55 U/L (ref 39–117)
Bilirubin, Direct: 0.1 mg/dL (ref 0.0–0.3)
Total Bilirubin: 0.6 mg/dL (ref 0.2–1.2)
Total Protein: 7.4 g/dL (ref 6.0–8.3)

## 2023-10-06 MED ORDER — CIPROFLOXACIN HCL 500 MG PO TABS: 500.0000 mg | ORAL_TABLET | Freq: Two times a day (BID) | ORAL | 0 refills | Status: AC

## 2023-10-06 NOTE — Progress Notes (Signed)
The test results show that your current treatment is OK, as the tests are stable.  Please continue the same plan.  There is no other need for change of treatment or further evaluation based on these results, at this time.  thanks 

## 2023-10-06 NOTE — Progress Notes (Unsigned)
Patient ID: Xavier Cochran., male   DOB: [birth year], 32 y.o.   MRN: [id number]         Chief Complaint:: wellness exam and Urinary Tract Infection (Pain in lower abdomen , cloudy and urgency , started the beginning of last week )  , pelvic pain, b12 deficieincy, asthma, allergies       HPI:  Xavier Cochran. is a 32 y.o. male here for wellness exam; for tdap at pharmacy o/w up to date                        Also s/p hemorrhoid banding recently.  Also with urinary urgency and sensation of incomplete emptying, in addition to pelvic pain mild intermittent for over 2 wks.  Denies urinary symptoms such as dysuria, flank pain, hematuria or n/v, fever, chills.   Pt denies polydipsia, polyuria, or new focal neuro s/s.    Pt denies fever, wt loss, night sweats, loss of appetite, or other constitutional symptoms  Does have several wks ongoing nasal allergy symptoms with clearish congestion, itch and sneezing, without fever, pain, ST, cough, swelling or wheezing.   Wt Readings from Last 3 Encounters:  10/06/23 198 lb (89.8 kg)  08/16/23 196 lb (88.9 kg)  06/19/23 198 lb 4 oz (89.9 kg)   BP Readings from Last 3 Encounters:  10/06/23 128/74  08/16/23 138/80  06/19/23 108/72   Immunization History  Administered Date(s) Administered   DTaP 06/22/1992, 08/31/1992, 10/26/1992, 08/09/1993, 06/04/1997   HIB (PRP-OMP) 06/22/1992, 08/31/1992, 10/26/1992, 08/09/1993   Hepatitis A 06/22/2010   Hepatitis B 06/22/1992, 08/31/1992, 08/09/1993   IPV 06/22/1992, 08/31/1992, 08/09/1993, 06/04/1997   MMR 08/09/1993, 06/04/1997   Meningococcal Conjugate 06/22/2010   Td 06/18/2009   Tdap 06/18/2009   There are no preventive care reminders to display for this patient.     Past Medical History:  Diagnosis Date   Allergic rhinitis, cause unspecified    Hemorrhoids    Past Surgical History:  Procedure Laterality Date   HEMORRHOID BANDING     SHOULDER ARTHROSCOPY WITH LABRAL REPAIR Right 06/01/2021   Procedure:  RIGHT SHOULDER ARTHROSCOPY WITH LABRAL REPAIR INFERIOR;  Surgeon: Cammy Copa, MD;  Location: [facility] OR;  Service: Orthopedics;  Laterality: Right;    reports that he has never smoked. He has never used smokeless tobacco. He reports current alcohol use. He reports that he does not use drugs. family history includes Lupus in his paternal grandmother. Allergies  Allergen Reactions   Doxycycline Nausea Only   No current outpatient medications on file prior to visit.   No current facility-administered medications on file prior to visit.        ROS:  All others reviewed and negative.  Objective        PE:  BP 128/74 (BP Location: Left Arm, Patient Position: Sitting, Cuff Size: Normal)   Pulse 67   Temp 98.2 F (36.8 C) (Oral)   Ht 5\' 10"  (1.778 m)   Wt 198 lb (89.8 kg)   SpO2 99%   BMI 28.41 kg/m                 Constitutional: Pt appears in NAD               HENT: Head: NCAT.                Right Ear: External ear normal.  Left Ear: External ear normal.                Eyes: . Pupils are equal, round, and reactive to light. Conjunctivae and EOM are normal               Nose: without d/c or deformity               Neck: Neck supple. Gross normal ROM               Cardiovascular: Normal rate and regular rhythm.                 Pulmonary/Chest: Effort normal and breath sounds without rales or wheezing.                Abd:  Soft, NT, ND, + BS, no organomegaly               Neurological: Pt is alert. At baseline orientation, motor grossly intact               Skin: Skin is warm. No rashes, no other new lesions, LE edema - none               Psychiatric: Pt behavior is normal without agitation   Micro: none  Cardiac tracings I have personally interpreted today:  none  Pertinent Radiological findings (summarize): none   Lab Results  Component Value Date   WBC 4.2 10/06/2023   HGB 15.2 10/06/2023   HCT 46.1 10/06/2023   PLT 275.0 10/06/2023   GLUCOSE 107 (H)  10/06/2023   CHOL 124 10/06/2023   TRIG 171.0 (H) 10/06/2023   HDL 40.20 10/06/2023   LDLCALC 50 10/06/2023   ALT 24 10/06/2023   AST 18 10/06/2023   NA 139 10/06/2023   K 4.0 10/06/2023   CL 102 10/06/2023   CREATININE 1.21 10/06/2023   BUN 14 10/06/2023   CO2 30 10/06/2023   TSH 1.85 10/06/2023   PSA 0.33 06/14/2019   Assessment/Plan:  Xavier Cochran. is a 32 y.o. Black or African American [2] male with  has a past medical history of Allergic rhinitis, cause unspecified and Hemorrhoids.  Encounter for well adult exam with abnormal findings Age and sex appropriate education and counseling updated with regular exercise and diet Referrals for preventative services - none needed Immunizations addressed - for tdap at pharmacy Smoking counseling  - none needed Evidence for depression or other mood disorder - none significant Most recent labs reviewed. I have personally reviewed and have noted: 1) the patient's medical and social history 2) The patient's current medications and supplements 3) The patient's height, weight, and BMI have been recorded in the chart   Urinary retention with incomplete bladder emptying Can't r/o uti- for ua and culture, consider urology if not improved  Pelvic pain In toto seems most c/w prostatitis - for cipro 500 bid.    B12 deficiency Lab Results  Component Value Date   VITAMINB12 252 10/06/2023   Low, to start oral replacement - b12 1000 mcg qd   Asthma Stable, declines need for inhaler prn for now  Allergic rhinitis Mild to mod, for otc nasacort asd,  to f/u any worsening symptoms or concerns  Followup: Return in about 1 year (around 10/05/2024).  Xavier Barre, MD 10/07/2023 9:11 PM [facility]

## 2023-10-06 NOTE — Patient Instructions (Addendum)
Please take all new medication as prescribed - the antibiotic  Please continue all other medications as before, and refills have been done if requested.  Please have the pharmacy call with any other refills you may need.  Please continue your efforts at being more active, low cholesterol diet, and weight control.  You are otherwise up to date with prevention measures today.  Please keep your appointments with your specialists as you may have planned  Please go to the LAB at the blood drawing area for the tests to be done  You will be contacted by phone if any changes need to be made immediately.  Otherwise, you will receive a letter about your results with an explanation, but please check with MyChart first.  Please make an Appointment to return for your 1 year visit, or sooner if needed

## 2023-10-07 ENCOUNTER — Encounter: Payer: Self-pay | Admitting: Internal Medicine

## 2023-10-07 LAB — URINE CULTURE: Result:: NO GROWTH

## 2023-10-07 NOTE — Assessment & Plan Note (Signed)

## 2023-10-07 NOTE — Assessment & Plan Note (Addendum)
Can't r/o uti- for ua and culture, consider urology if not improved

## 2023-10-07 NOTE — Assessment & Plan Note (Signed)
Mild to mod, for otc nasacort asd, to f/u any worsening symptoms or concerns ?

## 2023-10-07 NOTE — Assessment & Plan Note (Signed)
Lab Results  Component Value Date   VITAMINB12 252 10/06/2023   Low, to start oral replacement - b12 1000 mcg qd

## 2023-10-07 NOTE — Assessment & Plan Note (Signed)
In toto seems most c/w prostatitis - for cipro 500 bid.

## 2023-10-07 NOTE — Assessment & Plan Note (Signed)
Stable, declines need for inhaler prn for now

## 2023-11-23 ENCOUNTER — Encounter: Payer: BC Managed Care – PPO | Admitting: Internal Medicine

## 2024-01-19 ENCOUNTER — Encounter: Payer: Self-pay | Admitting: Internal Medicine

## 2024-01-19 ENCOUNTER — Encounter: Payer: BC Managed Care – PPO | Admitting: Internal Medicine

## 2024-01-19 MED ORDER — CIPROFLOXACIN HCL 500 MG PO TABS: 500.0000 mg | ORAL_TABLET | Freq: Two times a day (BID) | ORAL | 0 refills | Status: AC

## 2024-10-19 ENCOUNTER — Ambulatory Visit
Admission: EM | Admit: 2024-10-19 | Discharge: 2024-10-19 | Disposition: A | Discharge status: Home / Self Care | Attending: Internal Medicine | Admitting: Internal Medicine

## 2024-10-19 DIAGNOSIS — H60311 Diffuse otitis externa, right ear: Secondary | ICD-10-CM

## 2024-10-19 MED ORDER — CIPROFLOXACIN-DEXAMETHASONE 0.3-0.1 % OT SUSP: 4.0000 [drp] | Freq: Two times a day (BID) | OTIC | 0 refills | Status: AC

## 2024-10-19 NOTE — ED Provider Notes (Signed)
 [facility]    CSN: [id number] Arrival date & time: 10/19/24  0855      History   Chief Complaint Chief Complaint  Patient presents with   Otalgia    HPI Symeon Puleo. is a 33 y.o. male.   33 year old male who presents urgent care with complaints of right ear fullness.  He reports this started about 2 weeks ago but has gotten worse.  He denies any significant pain.  He does wear an ear pod in his right ear a lot.  He reports that his ear just feels very full.  He denies any fevers or chills.  He has not had any recent upper respiratory infections.   Otalgia Associated symptoms: no abdominal pain, no cough, no fever, no rash, no sore throat and no vomiting     Past Medical History:  Diagnosis Date   Allergic rhinitis, cause unspecified    Hemorrhoids     Patient Active Problem List   Diagnosis Date Noted   Encounter for well adult exam with abnormal findings 10/06/2023   Pelvic pain 10/06/2023   Urinary retention with incomplete bladder emptying 10/06/2023   Hematuria 08/21/2021   Prolapsed internal hemorrhoids, grade 2 04/12/2021   URI (upper respiratory infection) 02/24/2021   B12 deficiency 10/31/2020   Paresthesia of left arm 10/30/2020   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   MCL sprain of right knee 08/15/2019   Acute prostatitis 03/06/2019   Hemorrhoid 03/06/2019   Acute shoulder bursitis, right 02/27/2019   Ganglion cyst of foot 02/27/2019   Asthma 04/28/2018   Constipation 07/08/2016   Right knee pain 05/29/2015   Shoulder instability, right 01/17/2015   Right shoulder pain 02/03/2014   Proteinuria 11/15/2013   Right inguinal hernia 09/27/2013   DOE (dyspnea on exertion) 06/10/2013   Allergic rhinitis    External hemorrhoid, thrombosed 10/24/2012    Past Surgical History:  Procedure Laterality Date   HEMORRHOID BANDING     SHOULDER ARTHROSCOPY WITH LABRAL REPAIR Right 06/01/2021   Procedure: RIGHT SHOULDER ARTHROSCOPY WITH  LABRAL REPAIR INFERIOR;  Surgeon: Addie Cordella Hamilton, MD;  Location: [facility] OR;  Service: Orthopedics;  Laterality: Right;       Home Medications    Prior to Admission medications   Medication Sig Start Date End Date Taking? Authorizing Provider  ciprofloxacin -dexamethasone  (CIPRODEX ) OTIC suspension Place 4 drops into the right ear 2 (two) times daily for 7 days. 10/19/24 10/26/24 Yes Teresa Almarie LABOR, PA-C    Family History Family History  Problem Relation Age of Onset   Lupus Paternal Grandmother    Colon cancer Neg Hx    Rectal cancer Neg Hx    Stomach cancer Neg Hx    Esophageal cancer Neg Hx    Liver cancer Neg Hx    Pancreatic cancer Neg Hx     Social History Social History   Tobacco Use   Smoking status: Never   Smokeless tobacco: Never  Vaping Use   Vaping status: Never Used  Substance Use Topics   Alcohol use: Yes    Comment: occasional   Drug use: No     Allergies   Doxycycline    Review of Systems Review of Systems  Constitutional:  Negative for chills and fever.  HENT:  Positive for ear pain. Negative for sore throat.   Eyes:  Negative for pain and visual disturbance.  Respiratory:  Negative for cough and shortness of breath.   Cardiovascular:  Negative for chest pain and palpitations.  Gastrointestinal:  Negative for abdominal pain and vomiting.  Genitourinary:  Negative for dysuria and hematuria.  Musculoskeletal:  Negative for arthralgias and back pain.  Skin:  Negative for color change and rash.  Neurological:  Negative for seizures and syncope.  All other systems reviewed and are negative.    Physical Exam Triage Vital Signs ED Triage Vitals  Encounter Vitals Group     BP 10/19/24 0952 125/77     Girls Systolic BP Percentile --      Girls Diastolic BP Percentile --      Boys Systolic BP Percentile --      Boys Diastolic BP Percentile --      Pulse Rate 10/19/24 0952 64     Resp 10/19/24 0952 18     Temp 10/19/24 0952 98.2 F (36.8  C)     Temp Source 10/19/24 0952 Oral     SpO2 10/19/24 0952 96 %     Weight 10/19/24 0951 198 lb (89.8 kg)     Height 10/19/24 0951 5' 10 (1.778 m)     Head Circumference --      Peak Flow --      Pain Score 10/19/24 0950 0     Pain Loc --      Pain Education --      Exclude from Growth Chart --    No data found.  Updated Vital Signs BP 125/77 (BP Location: Left Arm)   Pulse 64   Temp 98.2 F (36.8 C) (Oral)   Resp 18   Ht 5' 10 (1.778 m)   Wt 198 lb (89.8 kg)   SpO2 96%   BMI 28.41 kg/m   Visual Acuity Right Eye Distance:   Left Eye Distance:   Bilateral Distance:    Right Eye Near:   Left Eye Near:    Bilateral Near:     Physical Exam Vitals and nursing note reviewed.  Constitutional:      General: He is not in acute distress.    Appearance: He is well-developed.  HENT:     Head: Normocephalic and atraumatic.     Right Ear: Tympanic membrane normal. Swelling present.     Left Ear: Tympanic membrane normal.     Ears:     Comments: Right ear canal is erythematous and edematous.  Tympanic membrane is not erythematous although there is a very small amount of clear fluid at the base. Eyes:     Conjunctiva/sclera: Conjunctivae normal.  Cardiovascular:     Rate and Rhythm: Normal rate and regular rhythm.  Pulmonary:     Effort: Pulmonary effort is normal. No respiratory distress.  Abdominal:     Palpations: Abdomen is soft.     Tenderness: There is no abdominal tenderness.  Musculoskeletal:        General: No swelling.     Cervical back: Neck supple.  Skin:    General: Skin is warm and dry.     Capillary Refill: Capillary refill takes less than 2 seconds.  Neurological:     Mental Status: He is alert.  Psychiatric:        Mood and Affect: Mood normal.      UC Treatments / Results  Labs (all labs ordered are listed, but only abnormal results are displayed) Labs Reviewed - No data to display  EKG   Radiology No results  found.  Procedures Procedures (including critical care time)  Medications Ordered in UC Medications - No data to display  Initial  Impression / Assessment and Plan / UC Course  I have reviewed the triage vital signs and the nursing notes.  Pertinent labs & imaging results that were available during my care of the patient were reviewed by me and considered in my medical decision making (see chart for details).     Acute diffuse otitis externa of right ear   Physical exam findings shows an edematous and erythematous right ear canal (red and swollen).  This is most consistent with acute otitis externa (external ear infection).  There is a small amount of fluid behind the tympanic membrane (eardrum) but this does not appear to be infectious in nature.  There is no cerumen buildup (earwax).  We will treat this with an eardrop that includes an antibiotic as well as a steroid to help with swelling.  We will treat with the following: Ciprodex  4 drops in the right ear twice daily for 7 days. Would avoid using the ear pod in the right ear until you have finished antibiotics Make sure to clean your ear pods regularly Return to urgent care or PCP if symptoms worsen or fail to resolve.    Final Clinical Impressions(s) / UC Diagnoses   Final diagnoses:  Acute diffuse otitis externa of right ear     Discharge Instructions      Physical exam findings shows an edematous and erythematous right ear canal (red and swollen).  This is most consistent with acute otitis externa (external ear infection).  There is a small amount of fluid behind the tympanic membrane (eardrum) but this does not appear to be infectious in nature.  There is no cerumen buildup (earwax).  We will treat this with an eardrop that includes an antibiotic as well as a steroid to help with swelling.  We will treat with the following: Ciprodex  4 drops in the right ear twice daily for 7 days. Would avoid using the ear pod in the right  ear until you have finished antibiotics Make sure to clean your ear pods regularly Return to urgent care or PCP if symptoms worsen or fail to resolve.      ED Prescriptions     Medication Sig Dispense Auth. Provider   ciprofloxacin -dexamethasone  (CIPRODEX ) OTIC suspension Place 4 drops into the right ear 2 (two) times daily for 7 days. 7.5 mL Teresa Almarie LABOR, [place]      PDMP not reviewed this encounter.   Teresa Almarie LABOR, [place] 10/19/24 1010

## 2024-10-19 NOTE — ED Triage Notes (Signed)
 Patient reports having right ear (? Pain) v/s fullness. Currently uses ear pods often so concerned it may be ear wax. No injury known.

## 2024-10-19 NOTE — Discharge Instructions (Addendum)
 Physical exam findings shows an edematous and erythematous right ear canal (red and swollen).  This is most consistent with acute otitis externa (external ear infection).  There is a small amount of fluid behind the tympanic membrane (eardrum) but this does not appear to be infectious in nature.  There is no cerumen buildup (earwax).  We will treat this with an eardrop that includes an antibiotic as well as a steroid to help with swelling.  We will treat with the following: Ciprodex  4 drops in the right ear twice daily for 7 days. Would avoid using the ear pod in the right ear until you have finished antibiotics Make sure to clean your ear pods regularly Return to urgent care or PCP if symptoms worsen or fail to resolve.

## 2024-11-02 ENCOUNTER — Encounter: Payer: Self-pay | Admitting: Emergency Medicine

## 2024-11-02 ENCOUNTER — Ambulatory Visit: Admission: EM | Admit: 2024-11-02 | Discharge: 2024-11-02 | Disposition: A | Discharge status: Home / Self Care

## 2024-11-02 DIAGNOSIS — H60391 Other infective otitis externa, right ear: Secondary | ICD-10-CM

## 2024-11-02 MED ORDER — OFLOXACIN 0.3 % OT SOLN: 5.0000 [drp] | Freq: Two times a day (BID) | OTIC | 0 refills | Status: AC

## 2024-11-02 NOTE — ED Triage Notes (Signed)
 Pt was seen here on 11/22 for right ear  pain.  Pt c/o continued pain but st's it isn't as bad as it was

## 2024-11-02 NOTE — Discharge Instructions (Addendum)
 Right otitis externa (external ear infection) has improved significantly with much less swelling but there is some residual redness.  We will treat for another 5 days but this time with just the antibiotic as there is no swelling now to indicate using the steroid.  We will treat with the following: Ofloxacin  4 to 5 drops in the right ear twice daily for 5 days Avoid using ear pods in this ear until finished with antibiotics Return to urgent care or PCP if symptoms worsen or fail to resolve.

## 2024-11-02 NOTE — ED Provider Notes (Signed)
 [facility]    CSN: [id number] Arrival date & time: 11/02/24  1017      History   Chief Complaint Chief Complaint  Patient presents with   Otalgia    HPI Xavier Asfaw. is a 33 y.o. male.   33 year old male who presents urgent care with complaints of some persistent right ear pain.  He reports that his ear is much better than it was but it is not completely resolved.  He still has some mild pain and was wanted to make sure that it was resolved now.  He denies any fevers or chills.  He does not have any loss of hearing now.   Otalgia Associated symptoms: no abdominal pain, no cough, no fever, no rash, no sore throat and no vomiting     Past Medical History:  Diagnosis Date   Allergic rhinitis, cause unspecified    Hemorrhoids     Patient Active Problem List   Diagnosis Date Noted   Encounter for well adult exam with abnormal findings 10/06/2023   Pelvic pain 10/06/2023   Urinary retention with incomplete bladder emptying 10/06/2023   Hematuria 08/21/2021   Prolapsed internal hemorrhoids, grade 2 04/12/2021   URI (upper respiratory infection) 02/24/2021   B12 deficiency 10/31/2020   Paresthesia of left arm 10/30/2020   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   MCL sprain of right knee 08/15/2019   Acute prostatitis 03/06/2019   Hemorrhoid 03/06/2019   Acute shoulder bursitis, right 02/27/2019   Ganglion cyst of foot 02/27/2019   Asthma 04/28/2018   Constipation 07/08/2016   Right knee pain 05/29/2015   Shoulder instability, right 01/17/2015   Right shoulder pain 02/03/2014   Proteinuria 11/15/2013   Right inguinal hernia 09/27/2013   DOE (dyspnea on exertion) 06/10/2013   Allergic rhinitis    External hemorrhoid, thrombosed 10/24/2012    Past Surgical History:  Procedure Laterality Date   HEMORRHOID BANDING     SHOULDER ARTHROSCOPY WITH LABRAL REPAIR Right 06/01/2021   Procedure: RIGHT SHOULDER ARTHROSCOPY WITH LABRAL REPAIR INFERIOR;   Surgeon: Addie Cordella Hamilton, MD;  Location: [facility] OR;  Service: Orthopedics;  Laterality: Right;       Home Medications    Prior to Admission medications   Medication Sig Start Date End Date Taking? Authorizing Provider  ofloxacin  (FLOXIN ) 0.3 % OTIC solution Place 5 drops into the right ear 2 (two) times daily for 5 days. 11/02/24 11/07/24 Yes Teresa Almarie LABOR, PA-C    Family History Family History  Problem Relation Age of Onset   Lupus Paternal Grandmother    Colon cancer Neg Hx    Rectal cancer Neg Hx    Stomach cancer Neg Hx    Esophageal cancer Neg Hx    Liver cancer Neg Hx    Pancreatic cancer Neg Hx     Social History Social History   Tobacco Use   Smoking status: Never   Smokeless tobacco: Never  Vaping Use   Vaping status: Never Used  Substance Use Topics   Alcohol use: Yes    Comment: occasional   Drug use: No     Allergies   Doxycycline    Review of Systems Review of Systems  Constitutional:  Negative for chills and fever.  HENT:  Positive for ear pain. Negative for sore throat.   Eyes:  Negative for pain and visual disturbance.  Respiratory:  Negative for cough and shortness of breath.   Cardiovascular:  Negative for chest pain and palpitations.  Gastrointestinal:  Negative for abdominal pain and vomiting.  Genitourinary:  Negative for dysuria and hematuria.  Musculoskeletal:  Negative for arthralgias and back pain.  Skin:  Negative for color change and rash.  Neurological:  Negative for seizures and syncope.  All other systems reviewed and are negative.    Physical Exam Triage Vital Signs ED Triage Vitals  Encounter Vitals Group     BP 11/02/24 1122 133/87     Girls Systolic BP Percentile --      Girls Diastolic BP Percentile --      Boys Systolic BP Percentile --      Boys Diastolic BP Percentile --      Pulse Rate 11/02/24 1122 64     Resp 11/02/24 1122 16     Temp 11/02/24 1122 98.2 F (36.8 C)     Temp Source 11/02/24 1122 Oral      SpO2 11/02/24 1122 95 %     Weight 11/02/24 1123 195 lb (88.5 kg)     Height 11/02/24 1123 5' 10 (1.778 m)     Head Circumference --      Peak Flow --      Pain Score 11/02/24 1123 3     Pain Loc --      Pain Education --      Exclude from Growth Chart --    No data found.  Updated Vital Signs BP 133/87 (BP Location: Left Arm)   Pulse 64   Temp 98.2 F (36.8 C) (Oral)   Resp 16   Ht 5' 10 (1.778 m)   Wt 195 lb (88.5 kg)   SpO2 95%   BMI 27.98 kg/m   Visual Acuity Right Eye Distance:   Left Eye Distance:   Bilateral Distance:    Right Eye Near:   Left Eye Near:    Bilateral Near:     Physical Exam Vitals and nursing note reviewed.  Constitutional:      General: He is not in acute distress.    Appearance: He is well-developed.  HENT:     Head: Normocephalic and atraumatic.     Right Ear: Tympanic membrane normal.     Left Ear: Tympanic membrane normal.     Ears:     Comments: Very mild residual erythema in the ear canal, no swelling male Eyes:     Conjunctiva/sclera: Conjunctivae normal.  Cardiovascular:     Rate and Rhythm: Normal rate and regular rhythm.     Heart sounds: No murmur heard. Pulmonary:     Effort: Pulmonary effort is normal. No respiratory distress.     Breath sounds: Normal breath sounds.  Abdominal:     Palpations: Abdomen is soft.     Tenderness: There is no abdominal tenderness.  Musculoskeletal:        General: No swelling.     Cervical back: Neck supple.  Skin:    General: Skin is warm and dry.     Capillary Refill: Capillary refill takes less than 2 seconds.  Neurological:     Mental Status: He is alert.  Psychiatric:        Mood and Affect: Mood normal.      UC Treatments / Results  Labs (all labs ordered are listed, but only abnormal results are displayed) Labs Reviewed - No data to display  EKG   Radiology No results found.  Procedures Procedures (including critical care time)  Medications Ordered in  UC Medications - No data to display  Initial Impression /  Assessment and Plan / UC Course  I have reviewed the triage vital signs and the nursing notes.  Pertinent labs & imaging results that were available during my care of the patient were reviewed by me and considered in my medical decision making (see chart for details).     Other infective acute otitis externa of right ear   Right otitis externa (external ear infection) has improved significantly with much less swelling but there is some residual redness.  We will treat for another 5 days but this time with just the antibiotic as there is no swelling now to indicate using the steroid.  We will treat with the following: Ofloxacin  4 to 5 drops in the right ear twice daily for 5 days Avoid using ear pods in this ear until finished with antibiotics Return to urgent care or PCP if symptoms worsen or fail to resolve.   Final Clinical Impressions(s) / UC Diagnoses   Final diagnoses:  Other infective acute otitis externa of right ear     Discharge Instructions      Right otitis externa (external ear infection) has improved significantly with much less swelling but there is some residual redness.  We will treat for another 5 days but this time with just the antibiotic as there is no swelling now to indicate using the steroid.  We will treat with the following: Ofloxacin  4 to 5 drops in the right ear twice daily for 5 days Avoid using ear pods in this ear until finished with antibiotics Return to urgent care or PCP if symptoms worsen or fail to resolve.      ED Prescriptions     Medication Sig Dispense Auth. Provider   ofloxacin  (FLOXIN ) 0.3 % OTIC solution Place 5 drops into the right ear 2 (two) times daily for 5 days. 5 mL Teresa Almarie LABOR, [place]      PDMP not reviewed this encounter.   Teresa Almarie LABOR, PA-C 11/02/24 1138

## 2024-12-02 ENCOUNTER — Telehealth: Admitting: Nurse Practitioner

## 2024-12-02 DIAGNOSIS — H6090 Unspecified otitis externa, unspecified ear: Secondary | ICD-10-CM | POA: Diagnosis not present

## 2024-12-02 DIAGNOSIS — H66001 Acute suppurative otitis media without spontaneous rupture of ear drum, right ear: Secondary | ICD-10-CM

## 2024-12-03 MED ORDER — AMOXICILLIN-POT CLAVULANATE 875-125 MG PO TABS: 1.0000 | ORAL_TABLET | Freq: Two times a day (BID) | ORAL | 0 refills | Status: AC

## 2024-12-03 NOTE — Progress Notes (Addendum)
 "  E-Visit for Ear Pain - Acute Otitis Media   We are sorry that you are not feeling well. Here is how we plan to help!  Based on what you have shared with me it looks like you have Acute Otitis Media.  Acute Otitis Media is an infection of the middle or inner ear. This type of infection can cause redness, inflammation, and fluid buildup behind the tympanic membrane (ear drum).  The usual symptoms include: Earache/Pain Fever Upper respiratory symptoms Lack of energy/Fatigue/Malaise Slight hearing loss gradually worsening- if the inner ear fills with fluid What causes middle ear infections? Most middle ear infections occur when an infection such as a cold, leads to a build-up of mucus in the middle ear and causes the Eustachian tube (a thin tube that runs from the middle ear to the back of the nose) to become swollen or blocked.   This means mucus can't drain away properly, making it easier for an infection to spread into the middle ear.  How middle ear infections are treated: Most ear infections clear up within three to five days and don't need any specific treatment. If necessary, tylenol  or ibuprofen should be used to relieve pain and a high temperature.  If you develop a fever higher than 102, or any significantly worsening symptoms, this could indicate a more serious infection moving to the middle/inner and needs face to face evaluation in an office by a provider.   Antibiotics aren't routinely used to treat middle ear infections, although they may occasionally be prescribed if symptoms persist or are particularly severe. Given your presentation,   I have prescribed Augmentin  875-125 mg one tablet by mouth twice a day for 10 days    Your symptoms should improve over the next 3 days and should resolve in about 7 days. Be sure to complete ALL of the prescription(s) given.  HOME CARE: Wash your hands frequently. If you are prescribed an ear drop, do not place the tip of the bottle on  your ear or touch it with your fingers. You can take Acetaminophen  650 mg every 4-6 hours as needed for pain.  If pain is severe or moderate, you can apply a heating pad (set on low) or hot water bottle (wrapped in a towel) to outer ear for 20 minutes.  This will also increase drainage.  GET HELP RIGHT AWAY IF: Fever is over 102.2 degrees. You develop progressive ear pain or hearing loss. Ear symptoms persist longer than 3 days after treatment.  MAKE SURE YOU: Understand these instructions. Will watch your condition. Will get help right away if you are not doing well or get worse.  Thank you for choosing an e-visit.  Your e-visit answers were reviewed by a board certified advanced clinical practitioner to complete your personal care plan. Depending upon the condition, your plan could have included both over the counter or prescription medications.  Please review your pharmacy choice. Make sure the pharmacy is open so you can pick up the prescription now. If there is a problem, you may contact your provider through [employer] and have the prescription routed to another pharmacy.  Your safety is important to us . If you have drug allergies check your prescription carefully.   For the next 24 hours you can use MyChart to ask questions about today's visit, request a non-urgent call back, or ask for a work or school excuse. You will get an email with a survey after your eVisit asking about your experience. We  would appreciate your feedback. I hope that your e-visit has been valuable and will aid in your recovery.  I have spent 5 minutes in review of e-visit questionnaire, review and updating patient chart, medical decision making and response to patient.   Lauraine Kitty, FNP "

## 2024-12-03 NOTE — Addendum Note (Signed)
 Addended by: KENNYTH LAURAINE BRAVO on: 12/03/2024 08:45 AM   Modules accepted: Orders, Level of Service

## 2024-12-30 ENCOUNTER — Ambulatory Visit: Payer: Self-pay

## 2024-12-30 NOTE — Telephone Encounter (Signed)
 FYI Only or Action Required?: FYI only for provider: appointment scheduled on 01/01/25.  Patient was last seen in primary care on 10/06/2023 by Norleen Lynwood ORN, MD.  Called Nurse Triage reporting Ear Fullness and Otalgia.  Symptoms began several days ago.  Interventions attempted: Prescription medications: Augmentin .  Symptoms are: unchanged.  Triage Disposition: See Within 3 Days in Office (overriding See Physician Within 24 Hours)  Patient/caregiver understands and will follow disposition?: Yes   Reason for Disposition  Earache  (Exceptions: Brief ear pain of lasting less than 60 minutes, or earache occurring during air travel.)  Answer Assessment - Initial Assessment Questions Patient states that he was seen at [facility] for an ear infection around the beginning of December. He was prescribed ear drops and an antibiotic. He states that symptoms improved, but never fully resolved. He was seen again via virtual office visit about a month ago and prescribed Augmentin , but symptoms started to worsen again a couple days ago. He mentions using ear drops from December that seemed to help with redness. He also notes that he did not finish course of antibiotics that were prescribed and mentions taking one the other day. Office visit advised, scheduled for soonest available based on patient's work schedule.   1. LOCATION: Which ear is involved?     Right ear  2. ONSET: When did the ear pain start?      About 2 months ago  3. SEVERITY: How bad is the pain?  (Scale 1-10; mild, moderate or severe)     4/10  4. URI SYMPTOMS: Do you have a runny nose or cough?     No  5. FEVER: Do you have a fever? If Yes, ask: What is your temperature, how was it measured, and when did it start?     No  6. CAUSE: Have you been swimming recently?, How often do you use Q-TIPS?, Have you had any recent air travel or scuba diving?     No  7. OTHER SYMPTOMS: Do you have any other symptoms? (e.g.,  decreased hearing, dizziness, headache, stiff neck, vomiting)     Ear fullness/congestion  8. PREGNANCY: Is there any chance you are pregnant? When was your last menstrual period?     NA  Protocols used: Earache-A-AH  Reason for Triage: Patient called; says he's still having pain since leaving the urgent care this weekend. Was provided antibiotics, pain is still persisting (dulled down, but not going away)

## 2025-01-01 ENCOUNTER — Encounter: Payer: Self-pay | Admitting: Internal Medicine

## 2025-01-01 ENCOUNTER — Ambulatory Visit: Admitting: Internal Medicine

## 2025-01-01 VITALS — BP 102/60 | HR 69 | Temp 98.0°F | Ht 70.0 in | Wt 205.2 lb

## 2025-01-01 DIAGNOSIS — H6991 Unspecified Eustachian tube disorder, right ear: Secondary | ICD-10-CM

## 2025-01-01 DIAGNOSIS — J309 Allergic rhinitis, unspecified: Secondary | ICD-10-CM

## 2025-01-01 MED ORDER — FLUTICASONE PROPIONATE 50 MCG/ACT NA SUSP: 2.0000 | Freq: Every day | NASAL | 6 refills | Status: AC

## 2025-01-01 NOTE — Progress Notes (Signed)
 "  Subjective:   Patient ID: Xavier Bobbetta Raddle., male    DOB: 1991/03/10, 34 y.o.   MRN: [id number]  Discussed the use of AI scribe software for clinical note transcription with the patient, who gave verbal consent to proceed.  History of Present Illness Xavier Cochran. is a 34 year old male who presents with persistent ear discomfort and sinus congestion.  He has been experiencing ear discomfort for approximately two months, beginning around mid to late November. The discomfort is intermittent, with a sensation of drainage when massaging the area behind the ear. Recently, the discomfort has improved, with pain now rated at 1 to 2 out of 10.  Initially, he received ear drops, which he felt did not effectively drain into the ear canal, as they would run back out. Subsequently, he was prescribed oral medication, which he believes improved his symptoms by about 50 to 60%. However, exposure to cold air seemed to exacerbate the symptoms again.  He also experiences sinus congestion and drainage, which he feels extends up through his sinuses. He has not been taking any long-term allergy medications such as Zyrtec  or Claritin. No fever, chills, sore throat, or cough, with symptoms primarily related to the ear and sinus congestion.  Occasionally, he experiences itching inside the ear, which he sometimes alleviates by using his finger. He has not observed any physical drainage from the ear.     Review of Systems  Constitutional: Negative.   HENT:  Positive for congestion, ear pain and postnasal drip.   Eyes: Negative.   Respiratory:  Negative for cough, chest tightness and shortness of breath.   Cardiovascular:  Negative for chest pain, palpitations and leg swelling.  Gastrointestinal:  Negative for abdominal distention, abdominal pain, constipation, diarrhea, nausea and vomiting.  Musculoskeletal: Negative.   Skin: Negative.   Neurological: Negative.   Psychiatric/Behavioral: Negative.       Objective:  Physical Exam Constitutional:      Appearance: He is well-developed.  HENT:     Head: Normocephalic and atraumatic.     Comments: Oropharynx with redness and clear drainage, nose with swollen turbinates    Left Ear: Tympanic membrane and ear canal normal.     Ears:     Comments: Right ear canal with redness but no TM bulging or drainage noted Neck:     Thyroid : No thyromegaly.  Cardiovascular:     Rate and Rhythm: Normal rate and regular rhythm.  Pulmonary:     Effort: Pulmonary effort is normal. No respiratory distress.     Breath sounds: Normal breath sounds. No wheezing or rales.  Abdominal:     General: Bowel sounds are normal. There is no distension.     Palpations: Abdomen is soft.     Tenderness: There is no abdominal tenderness.  Musculoskeletal:     Cervical back: Normal range of motion.  Lymphadenopathy:     Cervical: No cervical adenopathy.  Skin:    General: Skin is warm and dry.  Neurological:     Mental Status: He is alert and oriented to person, place, and time.     Coordination: Coordination normal.     Vitals:   01/01/25 0817  BP: 102/60  Pulse: 69  Temp: 98 F (36.7 C)  TempSrc: Oral  SpO2: 97%  Weight: 205 lb 3.2 oz (93.1 kg)  Height: 5' 10 (1.778 m)    Assessment and Plan Assessment & Plan Eustachian tube dysfunction and  allergic rhinitis   Intermittent ear pain and  congestion are due to Eustachian tube dysfunction secondary to allergic rhinitis. There is no current ear infection, but fluid likely contributes to symptoms. The role of sinuses and the Eustachian tube in fluid drainage and the use of allergy medications to prevent fluid buildup were explained. Prescribed Flonase  nasal spray to reduce sinus drainage and fluid buildup. Advised using over-the-counter hydrocortisone  cream for ear itching, applied with a Q-tip daily for one week.   "

## 2025-01-01 NOTE — Patient Instructions (Signed)
 You can use hydrocortisone  1% over the counter on a q tip daily for 1 week to help the irritation in the ear canal.   We have sent in flonase  to use in the nose 2 sprays each nostril daily for at least 2 weeks. You can use this ongoing for allergies if needed.
